# Patient Record
Sex: Male | Born: 1960 | Race: White | Hispanic: No | Marital: Married | Smoking: Never smoker
Health system: Southern US, Community
[De-identification: ages and names within clinical notes are randomized; demographics above are authoritative.]

## PROBLEM LIST (undated history)

## (undated) DIAGNOSIS — Z87442 Personal history of urinary calculi: Secondary | ICD-10-CM

## (undated) DIAGNOSIS — Z8489 Family history of other specified conditions: Secondary | ICD-10-CM

## (undated) DIAGNOSIS — R569 Unspecified convulsions: Secondary | ICD-10-CM

## (undated) DIAGNOSIS — Z9889 Other specified postprocedural states: Secondary | ICD-10-CM

## (undated) DIAGNOSIS — R112 Nausea with vomiting, unspecified: Secondary | ICD-10-CM

## (undated) DIAGNOSIS — I10 Essential (primary) hypertension: Secondary | ICD-10-CM

## (undated) DIAGNOSIS — K219 Gastro-esophageal reflux disease without esophagitis: Secondary | ICD-10-CM

## (undated) HISTORY — PX: ROTATOR CUFF REPAIR: SHX139

## (undated) HISTORY — PX: EYE SURGERY: SHX253

## (undated) HISTORY — PX: KNEE SURGERY: SHX244

## (undated) HISTORY — PX: LITHOTRIPSY: SUR834

## (undated) HISTORY — PX: APPENDECTOMY: SHX54

## (undated) HISTORY — PX: CHOLECYSTECTOMY: SHX55

## (undated) HISTORY — PX: TONSILLECTOMY: SUR1361

---

## 2004-04-25 HISTORY — PX: GASTRIC BYPASS: SHX52

## 2018-04-27 ENCOUNTER — Emergency Department (HOSPITAL_COMMUNITY)
Admission: EM | Admit: 2018-04-27 | Discharge: 2018-04-27 | Disposition: A | Discharge status: Home / Self Care | Payer: Self-pay | Attending: Emergency Medicine | Admitting: Emergency Medicine

## 2018-04-27 ENCOUNTER — Emergency Department (HOSPITAL_COMMUNITY): Payer: Self-pay

## 2018-04-27 ENCOUNTER — Encounter (HOSPITAL_COMMUNITY): Payer: Self-pay | Admitting: *Deleted

## 2018-04-27 DIAGNOSIS — I10 Essential (primary) hypertension: Secondary | ICD-10-CM | POA: Insufficient documentation

## 2018-04-27 DIAGNOSIS — M7981 Nontraumatic hematoma of soft tissue: Secondary | ICD-10-CM | POA: Insufficient documentation

## 2018-04-27 HISTORY — DX: Essential (primary) hypertension: I10

## 2018-04-27 HISTORY — DX: Unspecified convulsions: R56.9

## 2018-04-27 LAB — CBC
HCT: 34.7 % — ABNORMAL LOW (ref 39.0–52.0)
Hemoglobin: 10.6 g/dL — ABNORMAL LOW (ref 13.0–17.0)
MCH: 27.6 pg (ref 26.0–34.0)
MCHC: 30.5 g/dL (ref 30.0–36.0)
MCV: 90.4 fL (ref 80.0–100.0)
Platelets: 159 10*3/uL (ref 150–400)
RBC: 3.84 MIL/uL — ABNORMAL LOW (ref 4.22–5.81)
RDW: 16.4 % — ABNORMAL HIGH (ref 11.5–15.5)
WBC: 4.7 10*3/uL (ref 4.0–10.5)
nRBC: 0 % (ref 0.0–0.2)

## 2018-04-27 LAB — COMPREHENSIVE METABOLIC PANEL
ALT: 23 U/L (ref 0–44)
AST: 32 U/L (ref 15–41)
Albumin: 3.4 g/dL — ABNORMAL LOW (ref 3.5–5.0)
Alkaline Phosphatase: 49 U/L (ref 38–126)
Anion gap: 8 (ref 5–15)
BUN: 8 mg/dL (ref 6–20)
CO2: 26 mmol/L (ref 22–32)
Calcium: 8.5 mg/dL — ABNORMAL LOW (ref 8.9–10.3)
Chloride: 101 mmol/L (ref 98–111)
Creatinine, Ser: 0.87 mg/dL (ref 0.61–1.24)
GFR calc Af Amer: >60 mL/min (ref >60)
GFR calc non Af Amer: >60 mL/min (ref >60)
Glucose, Bld: 92 mg/dL (ref 70–99)
Potassium: 5 mmol/L (ref 3.5–5.1)
SODIUM: 135 mmol/L (ref 135–145)
Total Bilirubin: 0.3 mg/dL (ref 0.3–1.2)
Total Protein: 6.4 g/dL — ABNORMAL LOW (ref 6.5–8.1)

## 2018-04-27 LAB — URINALYSIS, ROUTINE W REFLEX MICROSCOPIC
Bacteria, UA: NONE SEEN
Bilirubin Urine: NEGATIVE
GLUCOSE, UA: NEGATIVE mg/dL
Ketones, ur: 5 mg/dL — AB
Leukocytes, UA: NEGATIVE
Nitrite: NEGATIVE
PROTEIN: NEGATIVE mg/dL
Specific Gravity, Urine: 1.011 (ref 1.005–1.030)
pH: 6 (ref 5.0–8.0)

## 2018-04-27 LAB — LIPASE, BLOOD: Lipase: 26 U/L (ref 11–51)

## 2018-04-27 MED ORDER — BENZONATATE 100 MG PO CAPS: 100.0000 mg | ORAL_CAPSULE | Freq: Three times a day (TID) | ORAL | 0 refills | Status: DC

## 2018-04-27 MED ORDER — MORPHINE SULFATE (PF) 4 MG/ML IV SOLN
4.0000 mg | Freq: Once | INTRAVENOUS | Status: AC
  Administered 2018-04-27: 4 mg via INTRAVENOUS
  Filled 2018-04-27: qty 1

## 2018-04-27 MED ORDER — SODIUM CHLORIDE 0.9 % IV BOLUS
1000.0000 mL | Freq: Once | INTRAVENOUS | Status: AC
  Administered 2018-04-27: 1000 mL via INTRAVENOUS

## 2018-04-27 MED ORDER — ONDANSETRON HCL 4 MG PO TABS: 4.0000 mg | ORAL_TABLET | Freq: Four times a day (QID) | ORAL | 0 refills | Status: DC

## 2018-04-27 MED ORDER — ONDANSETRON HCL 4 MG/2ML IJ SOLN
4.0000 mg | Freq: Once | INTRAMUSCULAR | Status: AC
  Administered 2018-04-27: 4 mg via INTRAVENOUS
  Filled 2018-04-27: qty 2

## 2018-04-27 MED ORDER — OXYCODONE-ACETAMINOPHEN 5-325 MG PO TABS: 1.0000 | ORAL_TABLET | Freq: Four times a day (QID) | ORAL | 0 refills | Status: DC | PRN

## 2018-04-27 MED ORDER — IOHEXOL 300 MG/ML  SOLN
100.0000 mL | Freq: Once | INTRAMUSCULAR | Status: AC | PRN
  Administered 2018-04-27: 100 mL via INTRAVENOUS

## 2018-04-27 NOTE — ED Triage Notes (Signed)
Pt in c/o LLQ abdominal pain that started a few days ago, reports nausea with this, area is tender to touch, coughing increases pain

## 2018-04-27 NOTE — Discharge Instructions (Signed)
You were evaluated in the emergency department for left-sided abdominal pain.  Your CAT scan showed that you had a rectus sheath hematoma.  This is a tear in your abdominal wall muscle.  This will usually repair itself over time.  We are sending you home with a prescription for pain medicine and nausea medicine along with some cough medicine.  We are also attempting to get you an abdominal binder which may help you stabilize that area.  Please follow-up with your doctor and return if any worsening symptoms.

## 2018-04-27 NOTE — ED Provider Notes (Signed)
[facility] Provider Note   CSN: [id number] Arrival date & time: 04/27/18  1221     History   Chief Complaint Chief Complaint  Patient presents with  . Abdominal Pain    HPI Gregory Blanchard is a 58 y.o. male.  He said he has had about a week of coughing.  He said about 2-week days ago he coughed so hard that he felt something tear in his left mid abdomen.  Since then the pain is been getting progressively worse and he rates it as severe now.  It is worse with palpation movement twisting.  He is also had a history of diverticulitis and kidney stone so he is not sure if it is that.  No fevers or chills no nausea no vomiting.  No diarrhea.  No urinary symptoms.  The history is provided by the patient.  Abdominal Pain   This is a new problem. The current episode started 2 days ago. The problem has been gradually worsening. The pain is associated with an unknown factor. The pain is located in the LLQ. The pain is severe. Pertinent negatives include fever, diarrhea, nausea, vomiting, constipation, dysuria, frequency and headaches. The symptoms are aggravated by certain positions, palpation and coughing. Nothing relieves the symptoms.    Past Medical History:  Diagnosis Date  . Hypertension   . Seizures (HCC)     There are no active problems to display for this patient.   History reviewed. No pertinent surgical history.      Home Medications    Prior to Admission medications   Not on File    Family History History reviewed. No pertinent family history.  Social History Social History   Tobacco Use  . Smoking status: Never Smoker  . Smokeless tobacco: Never Used  Substance Use Topics  . Alcohol use: Not on file  . Drug use: Not on file     Allergies   Sulfa antibiotics   Review of Systems Review of Systems  Constitutional: Negative for fever.  HENT: Negative for sore throat.   Eyes: Negative for visual disturbance.    Respiratory: Negative for shortness of breath.   Cardiovascular: Negative for chest pain.  Gastrointestinal: Positive for abdominal pain. Negative for constipation, diarrhea, nausea and vomiting.  Genitourinary: Negative for dysuria and frequency.  Musculoskeletal: Negative for back pain.  Skin: Negative for rash.  Neurological: Negative for headaches.     Physical Exam Updated Vital Signs BP (!) 138/100 (BP Location: Right Arm)   Pulse 83   Temp 98.3 F (36.8 C) (Oral)   Resp 17   SpO2 100%   Physical Exam Vitals signs and nursing note reviewed.  Constitutional:      Appearance: He is well-developed.  HENT:     Head: Normocephalic and atraumatic.  Eyes:     Conjunctiva/sclera: Conjunctivae normal.  Neck:     Musculoskeletal: Neck supple.  Cardiovascular:     Rate and Rhythm: Normal rate and regular rhythm.     Heart sounds: No murmur.  Pulmonary:     Effort: Pulmonary effort is normal. No respiratory distress.     Breath sounds: Normal breath sounds.  Abdominal:     Palpations: Abdomen is soft.     Tenderness: There is abdominal tenderness in the periumbilical area and left lower quadrant. There is no guarding or rebound.     Hernia: A hernia is present. Hernia is present in the ventral area.  Skin:    General: Skin  is warm and dry.     Capillary Refill: Capillary refill takes less than 2 seconds.  Neurological:     General: No focal deficit present.     Mental Status: He is alert.     Cranial Nerves: No cranial nerve deficit.     Motor: No weakness.      ED Treatments / Results  Labs (all labs ordered are listed, but only abnormal results are displayed) Labs Reviewed  COMPREHENSIVE METABOLIC PANEL - Abnormal; Notable for the following components:      Result Value   Calcium 8.5 (*)    Total Protein 6.4 (*)    Albumin 3.4 (*)    All other components within normal limits  CBC - Abnormal; Notable for the following components:   RBC 3.84 (*)     Hemoglobin 10.6 (*)    HCT 34.7 (*)    RDW 16.4 (*)    All other components within normal limits  URINALYSIS, ROUTINE W REFLEX MICROSCOPIC - Abnormal; Notable for the following components:   Hgb urine dipstick MODERATE (*)    Ketones, ur 5 (*)    All other components within normal limits  LIPASE, BLOOD    EKG None  Radiology Ct Abdomen Pelvis W Contrast  Result Date: 04/27/2018 CLINICAL DATA:  Acute generalized abdominal pain LEFT lower quadrant pain for few days, associated nausea, tenderness, and increased pain with coughing EXAM: CT ABDOMEN AND PELVIS WITH CONTRAST TECHNIQUE: Multidetector CT imaging of the abdomen and pelvis was performed using the standard protocol following bolus administration of intravenous contrast. Sagittal and coronal MPR images reconstructed from axial data set. CONTRAST:  OMNIPAQUE IOHEXOL 300 MG/ML SOLN IV. No oral contrast. COMPARISON:  None FINDINGS: Lower chest: Minimal RIGHT basilar atelectasis Hepatobiliary: Gallbladder surgically absent. Liver normal appearance. No biliary dilatation. Pancreas: Normal appearance Spleen: Normal appearance Adrenals/Urinary Tract: Adrenal glands normal appearance. BILATERAL nonobstructing renal calculi measuring up to 9 mm diameter at inferior pole RIGHT kidney and 6 mm diameter inferior pole LEFT kidney. Tiny cortical scar at upper pole RIGHT kidney. No renal mass or hydronephrosis. No ureteral calcification or dilatation. Bladder unremarkable. Stomach/Bowel: Increased stool in rectum. Postsurgical changes of gastric bypass surgery. Appendix not visualized. Bowel loops otherwise unremarkable. Vascular/Lymphatic: Aorta normal caliber. Circumaortic LEFT renal vein. No adenopathy. Reproductive: Unremarkable prostate gland and seminal vesicles Other: Small RIGHT inguinal hernia containing fat. No free air or free fluid. Focal thickening of the LEFT rectus abdominus muscle in the mid abdomen measuring 7.2 x 4.5 x 8.9 cm with mild  surrounding infiltrative change compatible with a LEFT rectus sheath hematoma. Musculoskeletal: No acute osseous findings. IMPRESSION: 7.2 x 4.5 x 8.9 cm LEFT rectus sheath hematoma. BILATERAL nonobstructing renal calculi. Small RIGHT inguinal hernia containing fat. Electronically Signed   By: Ulyses Southward M.D.   On: 04/27/2018 17:49    Procedures Procedures (including critical care time)  Medications Ordered in ED Medications  sodium chloride 0.9 % bolus 1,000 mL (0 mLs Intravenous Stopped 04/27/18 1748)  morphine 4 MG/ML injection 4 mg (4 mg Intravenous Given 04/27/18 1634)  ondansetron (ZOFRAN) injection 4 mg (4 mg Intravenous Given 04/27/18 1647)  iohexol (OMNIPAQUE) 300 MG/ML solution 100 mL (100 mLs Intravenous Contrast Given 04/27/18 1716)  morphine 4 MG/ML injection 4 mg (4 mg Intravenous Given 04/27/18 1746)     Initial Impression / Assessment and Plan / ED Course  I have reviewed the triage vital signs and the nursing notes.  Pertinent labs & imaging results  that were available during my care of the patient were reviewed by me and considered in my medical decision making (see chart for details).  Clinical Course as of Apr 29 1251  Caleen EssexFri Apr 27, 2018  1801 Patient CT significant for rectus sheath hematoma.  Few other incidental findings.  I reviewed all this with the patient and recommended that we send him home with some pain medication cough medication and will try to get him an abdominal binder.  He also asked for a prescription for some Zofran as the pain medication makes him nauseous.   [MB]    Clinical Course User Index [MB] Terrilee FilesButler, Mohd. Derflinger C, MD    Final Clinical Impressions(s) / ED Diagnoses   Final diagnoses:  Nontraumatic rectus hematoma    ED Discharge Orders         Ordered    ondansetron (ZOFRAN) 4 MG tablet  Every 6 hours     04/27/18 1803    oxyCODONE-acetaminophen (PERCOCET/ROXICET) 5-325 MG tablet  Every 6 hours PRN     04/27/18 1803    benzonatate (TESSALON)  100 MG capsule  Every 8 hours     04/27/18 1805           Terrilee FilesButler, Reese Senk C, MD 04/28/18 1253

## 2018-07-26 ENCOUNTER — Ambulatory Visit: Payer: Self-pay | Admitting: Family Medicine

## 2018-09-01 ENCOUNTER — Emergency Department (HOSPITAL_COMMUNITY): Payer: Self-pay

## 2018-09-01 ENCOUNTER — Encounter (HOSPITAL_COMMUNITY): Payer: Self-pay | Admitting: Emergency Medicine

## 2018-09-01 ENCOUNTER — Other Ambulatory Visit: Payer: Self-pay

## 2018-09-01 ENCOUNTER — Emergency Department (HOSPITAL_COMMUNITY)
Admission: EM | Admit: 2018-09-01 | Discharge: 2018-09-01 | Disposition: A | Discharge status: Home / Self Care | Payer: Self-pay | Attending: Emergency Medicine | Admitting: Emergency Medicine

## 2018-09-01 DIAGNOSIS — M545 Low back pain, unspecified: Secondary | ICD-10-CM

## 2018-09-01 DIAGNOSIS — H5702 Anisocoria: Secondary | ICD-10-CM | POA: Insufficient documentation

## 2018-09-01 DIAGNOSIS — W1830XA Fall on same level, unspecified, initial encounter: Secondary | ICD-10-CM | POA: Insufficient documentation

## 2018-09-01 DIAGNOSIS — W19XXXA Unspecified fall, initial encounter: Secondary | ICD-10-CM

## 2018-09-01 DIAGNOSIS — Y92010 Kitchen of single-family (private) house as the place of occurrence of the external cause: Secondary | ICD-10-CM | POA: Insufficient documentation

## 2018-09-01 DIAGNOSIS — M542 Cervicalgia: Secondary | ICD-10-CM | POA: Insufficient documentation

## 2018-09-01 DIAGNOSIS — G8911 Acute pain due to trauma: Secondary | ICD-10-CM

## 2018-09-01 DIAGNOSIS — M25512 Pain in left shoulder: Secondary | ICD-10-CM | POA: Insufficient documentation

## 2018-09-01 DIAGNOSIS — Y939 Activity, unspecified: Secondary | ICD-10-CM | POA: Insufficient documentation

## 2018-09-01 DIAGNOSIS — R51 Headache: Secondary | ICD-10-CM | POA: Insufficient documentation

## 2018-09-01 DIAGNOSIS — Y999 Unspecified external cause status: Secondary | ICD-10-CM | POA: Insufficient documentation

## 2018-09-01 DIAGNOSIS — M25532 Pain in left wrist: Secondary | ICD-10-CM | POA: Insufficient documentation

## 2018-09-01 DIAGNOSIS — S7012XA Contusion of left thigh, initial encounter: Secondary | ICD-10-CM | POA: Insufficient documentation

## 2018-09-01 LAB — CBC WITH DIFFERENTIAL/PLATELET
Abs Immature Granulocytes: 0.02 10*3/uL (ref 0.00–0.07)
Basophils Absolute: 0 10*3/uL (ref 0.0–0.1)
Basophils Relative: 0 %
Eosinophils Absolute: 0.2 10*3/uL (ref 0.0–0.5)
Eosinophils Relative: 2 %
HCT: 35 % — ABNORMAL LOW (ref 39.0–52.0)
Hemoglobin: 10.8 g/dL — ABNORMAL LOW (ref 13.0–17.0)
Immature Granulocytes: 0 %
Lymphocytes Relative: 23 %
Lymphs Abs: 1.9 10*3/uL (ref 0.7–4.0)
MCH: 27.8 pg (ref 26.0–34.0)
MCHC: 30.9 g/dL (ref 30.0–36.0)
MCV: 90 fL (ref 80.0–100.0)
Monocytes Absolute: 0.7 10*3/uL (ref 0.1–1.0)
Monocytes Relative: 9 %
Neutro Abs: 5.3 10*3/uL (ref 1.7–7.7)
Neutrophils Relative %: 66 %
Platelets: 261 10*3/uL (ref 150–400)
RBC: 3.89 MIL/uL — ABNORMAL LOW (ref 4.22–5.81)
RDW: 18 % — ABNORMAL HIGH (ref 11.5–15.5)
WBC: 8.2 10*3/uL (ref 4.0–10.5)
nRBC: 0 % (ref 0.0–0.2)

## 2018-09-01 LAB — BASIC METABOLIC PANEL
Anion gap: 8 (ref 5–15)
BUN: 10 mg/dL (ref 6–20)
CO2: 24 mmol/L (ref 22–32)
Calcium: 8.8 mg/dL — ABNORMAL LOW (ref 8.9–10.3)
Chloride: 106 mmol/L (ref 98–111)
Creatinine, Ser: 0.89 mg/dL (ref 0.61–1.24)
GFR calc Af Amer: >60 mL/min (ref >60)
GFR calc non Af Amer: >60 mL/min (ref >60)
Glucose, Bld: 98 mg/dL (ref 70–99)
Potassium: 4.3 mmol/L (ref 3.5–5.1)
Sodium: 138 mmol/L (ref 135–145)

## 2018-09-01 LAB — APTT: aPTT: 32 seconds (ref 24–36)

## 2018-09-01 LAB — PROTIME-INR
INR: 1.1 (ref 0.8–1.2)
Prothrombin Time: 13.7 seconds (ref 11.4–15.2)

## 2018-09-01 MED ORDER — ONDANSETRON 4 MG PO TBDP
4.0000 mg | ORAL_TABLET | Freq: Once | ORAL | Status: AC
  Administered 2018-09-01: 4 mg via ORAL
  Filled 2018-09-01: qty 1

## 2018-09-01 MED ORDER — OXYCODONE HCL 5 MG PO TABS: 5.0000 mg | ORAL_TABLET | Freq: Four times a day (QID) | ORAL | 0 refills | Status: DC | PRN

## 2018-09-01 MED ORDER — ACETAMINOPHEN 500 MG PO TABS
1000.0000 mg | ORAL_TABLET | Freq: Once | ORAL | Status: AC
  Administered 2018-09-01: 1000 mg via ORAL
  Filled 2018-09-01: qty 2

## 2018-09-01 MED ORDER — OXYCODONE HCL 5 MG PO TABS
5.0000 mg | ORAL_TABLET | Freq: Once | ORAL | Status: AC
  Administered 2018-09-01: 5 mg via ORAL
  Filled 2018-09-01: qty 1

## 2018-09-01 MED ORDER — HYDROMORPHONE HCL 1 MG/ML IJ SOLN
1.0000 mg | Freq: Once | INTRAMUSCULAR | Status: AC
  Administered 2018-09-01: 1 mg via INTRAVENOUS
  Filled 2018-09-01: qty 1

## 2018-09-01 MED ORDER — CYCLOBENZAPRINE HCL 10 MG PO TABS: 10.0000 mg | ORAL_TABLET | Freq: Two times a day (BID) | ORAL | 0 refills | Status: DC | PRN

## 2018-09-01 MED ORDER — ONDANSETRON HCL 4 MG PO TABS: 4.0000 mg | ORAL_TABLET | Freq: Three times a day (TID) | ORAL | 0 refills | Status: DC | PRN

## 2018-09-01 NOTE — ED Provider Notes (Signed)
[facility] EMERGENCY DEPARTMENT Provider Note   CSN: [id number] Arrival date & time: 09/01/18  0940    History   Chief Complaint Chief Complaint  Patient presents with   Fall   Hip Pain    HPI Gregory Blanchard is a 58 y.o. male with history of hypertension and seizures presents for evaluation of acute onset, progressively worsening ecchymosis and swelling to the left hip after fall yesterday.  He reports that he slipped in the kitchen on a slippery substance on the floor, landing on his left side.  He is unsure if he hit his head or lost consciousness.  He reports immediate onset of left shoulder and left wrist pain as well as left hip pain.  Also notes immediate development of ecchymosis to the lateral aspect of the left hip and thigh, with progressively worsened overnight and when he awoke this morning he noticed a very large hematoma to the lateral aspect of his left hip and thigh.  He notes a constant pain which worsens with prolonged positioning as well as ambulating.  Also reports neck and low back pain which she reports is generally chronic for him secondary to known arthritis.  Reports the pain in the left shoulder and wrist are mild.  Also notes a frontal headache which he states is mild in nature.  Denies fever, nausea, vomiting, chest pain, shortness of breath, abdominal pain, vision changes, or confusion.  Denies bowel or bladder incontinence, saddle paresthesias, or history of IV drug use.  He has been taking over-the-counter anti-inflammatories without relief of his symptoms.  He has a history of cluster headaches.  No known history of blood dyscrasia and is not currently on any anticoagulants.    The history is provided by the patient.    Past Medical History:  Diagnosis Date   Hypertension    Seizures (HCC)     There are no active problems to display for this patient.   History reviewed. No pertinent surgical history.      Home Medications     Prior to Admission medications   Medication Sig Start Date End Date Taking? Authorizing Provider  naproxen sodium (ALEVE) 220 MG tablet Take 220 mg by mouth daily as needed (pain).   Yes [provider]  omeprazole (PRILOSEC) 20 MG capsule Take 20 mg by mouth daily.   Yes [provider]  benzonatate (TESSALON) 100 MG capsule Take 1 capsule (100 mg total) by mouth every 8 (eight) hours. Patient not taking: Reported on 09/01/2018 04/27/18   Terrilee Files, MD  cyclobenzaprine (FLEXERIL) 10 MG tablet Take 1 tablet (10 mg total) by mouth 2 (two) times daily as needed for muscle spasms. 09/01/18   Raygen Dahm A, PA-C  ondansetron (ZOFRAN) 4 MG tablet Take 1 tablet (4 mg total) by mouth every 8 (eight) hours as needed for nausea or vomiting. 09/01/18   Lyrika Souders A, PA-C  oxyCODONE (ROXICODONE) 5 MG immediate release tablet Take 1 tablet (5 mg total) by mouth every 6 (six) hours as needed for severe pain. 09/01/18   Laquetta Racey A, PA-C  oxyCODONE-acetaminophen (PERCOCET/ROXICET) 5-325 MG tablet Take 1-2 tablets by mouth every 6 (six) hours as needed for severe pain. Patient not taking: Reported on 09/01/2018 04/27/18   Terrilee Files, MD    Family History No family history on file.  Social History Social History   Tobacco Use   Smoking status: Never Smoker   Smokeless tobacco: Never Used  Substance Use Topics  Alcohol use: Not on file   Drug use: Not on file     Allergies   Sulfa antibiotics   Review of Systems Review of Systems  Constitutional: Negative for chills and fever.  Eyes: Negative for photophobia and visual disturbance.  Respiratory: Negative for shortness of breath.   Gastrointestinal: Negative for abdominal pain, nausea and vomiting.  Musculoskeletal: Positive for back pain and neck pain.  Skin: Positive for color change.  Neurological: Positive for headaches.  All other systems reviewed and are negative.    Physical Exam Updated Vital  Signs BP 122/90    Pulse 84    Temp 98.2 F (36.8 C)    Resp 19    Ht  (1.854 m)    Wt 113.4 kg    SpO2 97%    BMI 32.98 kg/m   Physical Exam Vitals signs and nursing note reviewed.  Constitutional:      General: He is not in acute distress.    Appearance: He is well-developed.  HENT:     Head: Normocephalic and atraumatic.     Comments: No Battle's signs, no raccoon's eyes, no rhinorrhea. No hemotympanum. No tenderness to palpation of the face or skull. No deformity, crepitus, or swelling noted.  Eyes:     General:        Right eye: No discharge.        Left eye: No discharge.     Extraocular Movements: Extraocular movements intact.     Conjunctiva/sclera: Conjunctivae normal.     Comments: Anisocoria noted, left pupil 5mm and reactive to light, right pupil 2mm and minimally reactive to light.  No chemosis, proptosis, or consensual photophobia  Neck:     Musculoskeletal: Neck supple.     Vascular: No JVD.     Trachea: No tracheal deviation.     Comments: Some midline cervical spine tenderness around C3-C6 with left paracervical muscle tenderness and spasm in the trapezius distribution.  No deformity or crepitus noted. Cardiovascular:     Rate and Rhythm: Normal rate and regular rhythm.     Pulses: Normal pulses.     Comments: 2+ radial and DP/PT pulses bilaterally Pulmonary:     Effort: Pulmonary effort is normal.     Breath sounds: Normal breath sounds.  Abdominal:     General: Abdomen is flat. There is no distension.     Palpations: Abdomen is soft.     Tenderness: There is no abdominal tenderness. There is no guarding or rebound.  Musculoskeletal:     Comments: Left paralumbar muscle tenderness.  No deformity, crepitus, or step-off noted on palpation of the midline thoracic or lumbar spine.  5/5 strength of BUE and BLE major muscle groups.  22 x 17 cm hematoma noted to the lateral aspect of the left thigh.  Normal passive range of motion of the hip.  No deformities  noted.  No significant tenderness to palpation of the left shoulder or wrist.  No snuffbox tenderness.  Skin:    General: Skin is warm and dry.     Findings: No erythema.  Neurological:     Mental Status: He is alert.  Psychiatric:        Behavior: Behavior normal.      ED Treatments / Results  Labs (all labs ordered are listed, but only abnormal results are displayed) Labs Reviewed  CBC WITH DIFFERENTIAL/PLATELET - Abnormal; Notable for the following components:      Result Value   RBC 3.89 (*)  Hemoglobin 10.8 (*)    HCT 35.0 (*)    RDW 18.0 (*)    All other components within normal limits  BASIC METABOLIC PANEL - Abnormal; Notable for the following components:   Calcium 8.8 (*)    All other components within normal limits  PROTIME-INR  APTT    EKG None  Radiology Dg Wrist Complete Left  Result Date: 09/01/2018 CLINICAL DATA:  Fall, left wrist pain EXAM: LEFT WRIST - COMPLETE 3+ VIEW COMPARISON:  None. FINDINGS: Normal alignment without acute osseous finding or fracture. Distal radius, ulna and carpal bones appear intact. 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 IMPRESSION: Degenerative osteoarthritis.  No acute finding by plain radiography Electronically Signed   By: Judie Petit.  Shick M.D.   On: 09/01/2018 11:00   Ct Head Wo Contrast  Result Date: 09/01/2018 CLINICAL DATA:  Posttraumatic headache after fall last night. EXAM: CT HEAD WITHOUT CONTRAST CT CERVICAL SPINE WITHOUT CONTRAST TECHNIQUE: Multidetector CT imaging of the head and cervical spine was performed following the standard protocol without intravenous contrast. Multiplanar CT image reconstructions of the cervical spine were also generated. COMPARISON:  None. FINDINGS: CT HEAD FINDINGS Brain: No evidence of acute infarction, hemorrhage, hydrocephalus, extra-axial collection or mass lesion/mass effect.  Vascular: No hyperdense vessel or unexpected calcification. Skull: Normal. Negative for fracture or focal lesion. Sinuses/Orbits: No acute finding. Other: None. CT CERVICAL SPINE FINDINGS Alignment: Normal. Skull base and vertebrae: No acute fracture. No primary bone lesion or focal pathologic process. Soft tissues and spinal canal: No prevertebral fluid or swelling. No visible canal hematoma. Disc levels: Severe degenerative disc disease is noted at C5-6 and C6-7 with anterior osteophyte formation at this level. Large anterior osteophyte formation is also noted at C3-4 and C4-5. Upper chest: Negative. Other: None. IMPRESSION: Normal head CT. Severe multilevel degenerative disc disease. No acute abnormality seen in the cervical spine. Electronically Signed   By: Lupita Raider M.D.   On: 09/01/2018 12:32   Ct Cervical Spine Wo Contrast  Result Date: 09/01/2018 CLINICAL DATA:  Posttraumatic headache after fall last night. EXAM: CT HEAD WITHOUT CONTRAST CT CERVICAL SPINE WITHOUT CONTRAST TECHNIQUE: Multidetector CT imaging of the head and cervical spine was performed following the standard protocol without intravenous contrast. Multiplanar CT image reconstructions of the cervical spine were also generated. COMPARISON:  None. FINDINGS: CT HEAD FINDINGS Brain: No evidence of acute infarction, hemorrhage, hydrocephalus, extra-axial collection or mass lesion/mass effect. Vascular: No hyperdense vessel or unexpected calcification. Skull: Normal. Negative for fracture or focal lesion. Sinuses/Orbits: No acute finding. Other: None. CT CERVICAL SPINE FINDINGS Alignment: Normal. Skull base and vertebrae: No acute fracture. No primary bone lesion or focal pathologic process. Soft tissues and spinal canal: No prevertebral fluid or swelling. No visible canal hematoma. Disc levels: Severe degenerative disc disease is noted at C5-6 and C6-7 with anterior osteophyte formation at this level. Large anterior osteophyte formation is  also noted at C3-4 and C4-5. Upper chest: Negative. Other: None. IMPRESSION: Normal head CT. Severe multilevel degenerative disc disease. No acute abnormality seen in the cervical spine. Electronically Signed   By: Lupita Raider M.D.   On: 09/01/2018 12:32   Dg Hip Unilat W Or Wo Pelvis 2-3 Views Left  Result Date: 09/01/2018 CLINICAL DATA:  Pt states he slipped in the kitchen last night and fell, has bruising and swelling to the left hip/thigh. Pt also has pain  to left wrist. fall EXAM: DG HIP (WITH OR WITHOUT PELVIS) 2-3V LEFT COMPARISON:  CT 04/27/2018 FINDINGS: No evidence of pelvic fracture or sacral fracture. Hips are located. Dedicated evaluation of the LEFT femur demonstrates no femoral neck fracture. IMPRESSION: No pelvic fracture or LEFT femoral neck fracture. Electronically Signed   By: Genevive BiStewart  Edmunds M.D.   On: 09/01/2018 11:02    Procedures Procedures (including critical care time)  Medications Ordered in ED Medications  oxyCODONE (Oxy IR/ROXICODONE) immediate release tablet 5 mg (5 mg Oral Given 09/01/18 1115)  acetaminophen (TYLENOL) tablet 1,000 mg (1,000 mg Oral Given 09/01/18 1115)  ondansetron (ZOFRAN-ODT) disintegrating tablet 4 mg (4 mg Oral Given 09/01/18 1115)  HYDROmorphone (DILAUDID) injection 1 mg (1 mg Intravenous Given 09/01/18 1232)     Initial Impression / Assessment and Plan / ED Course  I have reviewed the triage vital signs and the nursing notes.  Pertinent labs & imaging results that were available during my care of the patient were reviewed by me and considered in my medical decision making (see chart for details).        Patient presenting for evaluation after fall.  He is afebrile, initially hypertensive but I feel this is secondary to pain as his vital signs improved after administration of pain medicine. He is nontoxic in appearance.  He is neurovascularly intact.  His primary complaint is pain secondary to a large hematoma to the left hip/thigh.  Of  note, he was seen a few months ago with a rectus hematoma after persistent cough.  He reports that he has not been seen by a PCP [place] and has not had any blood work done in a few years.  He does not think that he has any history of coagulopathies/blood dyscrasias and he is not currently anticoagulated.  Also of note, he exhibited unequal pupils on physical examination.  He states that he has a cataract to the left eye so this may be baseline for him but he is unsure.  He is complaining of a mild frontal headache, not as severe as his usual cluster headaches.  He has an overall normal neurologic examination and no ptosis or anhydrosis to suggest Horner syndrome.  Given his reassuring neurologic status and negative head CT, doubt SAH, ICH, CVA, brain herniation, or other life-threatening intracranial abnormality.  Imaging of the cervical spine significant for multilevel degenerative disc disease but no acute abnormalities.  Doubt spinal cord syndrome, no red flag signs concerning for cauda equina or spinal abscess.  Lab work reviewed by me shows no leukocytosis, anemia at patient's baseline per the patient reviewing MyChart on his phone.  No metabolic derangements, no renal insufficiency.  INR and APTT within normal limits, which is reassuring.  Radiographs of the pelvis, left hip and left wrist show no evidence of acute osseous abnormality.  Pain is controlled in the ED.  He is able to ambulate with the aid of crutches.  Will give Ace wrap for compression, oxycodone for severe breakthrough pain and Flexeril for muscle relaxation purposes.  We discussed side effects of medications and appropriate usage.  Also discussed conservative management with heat therapy, gentle stretching, and follow-up with a PCP for reevaluation of his predisposition to hematomas and possible missed fracture diagnosis if he experiences any persistent pains.  Discussed strict ED return precautions. Pt  verbalized understanding of and agreement with plan and is safe for discharge home at this time.   Final Clinical Impressions(s) /  ED Diagnoses   Final diagnoses:  Fall, initial encounter  Hematoma of left thigh, initial encounter  Neck pain on left side  Acute pain of left wrist  Acute pain of left shoulder due to trauma  Acute right-sided low back pain without sciatica  Anisocoria    ED Discharge Orders         Ordered    cyclobenzaprine (FLEXERIL) 10 MG tablet  2 times daily PRN     09/01/18 1339    oxyCODONE (ROXICODONE) 5 MG immediate release tablet  Every 6 hours PRN     09/01/18 1339    ondansetron (ZOFRAN) 4 MG tablet  Every 8 hours PRN     09/01/18 1339           Jeanie Sewer, PA-C 09/01/18 1432    Alvira Monday, MD 09/03/18 1007

## 2018-09-01 NOTE — Discharge Instructions (Signed)
1. Medications: Alternate 600 mg of ibuprofen and [id number] mg of Tylenol every 3 hours as needed for pain. Do not exceed 4000 mg of Tylenol daily.  Take ibuprofen with food to avoid upset stomach issues.  You can take oxycodone as needed for severe breakthrough pain but do not drive, drink alcohol, or operate heavy machinery while taking this medicine as it may make you drowsy.  The same applies for Flexeril which is a muscle relaxer.  I typically recommend only taking these medications at night to help you sleep.  You can also cut these tablets in half if they are too strong.  Oxycodone can also cause constipation, so you may want to take a stool softener to help avoid the side effect. 2. Treatment: rest, apply Ace wrap for compression.  Use crutches or walker to help you get around, weight-bear on the left hip as tolerated.  Apply ice or heat, whichever feels best, 20 minutes at a time 3-4 times daily.  Do some gentle stretching to avoid muscle stiffness and soreness.  You can also YouTube search physical therapy exercises for the neck, hip, and low back. 3. Follow Up: Please followup with a primary care provider  in 1-2 week if no improvement for discussion of your diagnoses and further evaluation after today's visit; if you do not have a primary care doctor use the resource guide provided to find one; Please return to the ER for worsening symptoms or other concerns such as worsening swelling, redness of the skin, fevers, severe headaches, persistent vomiting, loss of pulses, or loss of feeling

## 2018-09-01 NOTE — ED Triage Notes (Signed)
Pt states he slipped in the kitchen last night, has bruising and swelling to the left hip/thigh. Pt did not hit head, is not on blood thinners. Pt also has pain to left wrist.

## 2018-09-01 NOTE — ED Triage Notes (Signed)
Pt had a fall last night, landed on L hip. C/o large football sized hematoma present to L lateral hip, does not take thinners. States he is able to bear weight on L lower extremity.

## 2018-10-07 ENCOUNTER — Emergency Department (HOSPITAL_COMMUNITY): Payer: Self-pay

## 2018-10-07 ENCOUNTER — Emergency Department (HOSPITAL_COMMUNITY)
Admission: EM | Admit: 2018-10-07 | Discharge: 2018-10-07 | Disposition: A | Discharge status: Home / Self Care | Payer: Self-pay | Attending: Emergency Medicine | Admitting: Emergency Medicine

## 2018-10-07 DIAGNOSIS — Y9389 Activity, other specified: Secondary | ICD-10-CM | POA: Insufficient documentation

## 2018-10-07 DIAGNOSIS — Y9222 Religious institution as the place of occurrence of the external cause: Secondary | ICD-10-CM | POA: Insufficient documentation

## 2018-10-07 DIAGNOSIS — W01190A Fall on same level from slipping, tripping and stumbling with subsequent striking against furniture, initial encounter: Secondary | ICD-10-CM | POA: Insufficient documentation

## 2018-10-07 DIAGNOSIS — S8001XA Contusion of right knee, initial encounter: Secondary | ICD-10-CM | POA: Insufficient documentation

## 2018-10-07 DIAGNOSIS — Y999 Unspecified external cause status: Secondary | ICD-10-CM | POA: Insufficient documentation

## 2018-10-07 DIAGNOSIS — Z79899 Other long term (current) drug therapy: Secondary | ICD-10-CM | POA: Insufficient documentation

## 2018-10-07 DIAGNOSIS — I10 Essential (primary) hypertension: Secondary | ICD-10-CM | POA: Insufficient documentation

## 2018-10-07 LAB — CBC
HCT: 32.6 % — ABNORMAL LOW (ref 39.0–52.0)
Hemoglobin: 10.1 g/dL — ABNORMAL LOW (ref 13.0–17.0)
MCH: 27.4 pg (ref 26.0–34.0)
MCHC: 31 g/dL (ref 30.0–36.0)
MCV: 88.3 fL (ref 80.0–100.0)
Platelets: 281 10*3/uL (ref 150–400)
RBC: 3.69 MIL/uL — ABNORMAL LOW (ref 4.22–5.81)
RDW: 16 % — ABNORMAL HIGH (ref 11.5–15.5)
WBC: 9.7 10*3/uL (ref 4.0–10.5)
nRBC: 0 % (ref 0.0–0.2)

## 2018-10-07 LAB — BASIC METABOLIC PANEL
Anion gap: 9 (ref 5–15)
BUN: 9 mg/dL (ref 6–20)
CO2: 21 mmol/L — ABNORMAL LOW (ref 22–32)
Calcium: 8.4 mg/dL — ABNORMAL LOW (ref 8.9–10.3)
Chloride: 107 mmol/L (ref 98–111)
Creatinine, Ser: 0.97 mg/dL (ref 0.61–1.24)
GFR calc Af Amer: >60 mL/min (ref >60)
GFR calc non Af Amer: >60 mL/min (ref >60)
Glucose, Bld: 101 mg/dL — ABNORMAL HIGH (ref 70–99)
Potassium: 3.8 mmol/L (ref 3.5–5.1)
Sodium: 137 mmol/L (ref 135–145)

## 2018-10-07 MED ORDER — ONDANSETRON HCL 4 MG/2ML IJ SOLN
4.0000 mg | Freq: Once | INTRAMUSCULAR | Status: AC
  Administered 2018-10-07: 4 mg via INTRAVENOUS
  Filled 2018-10-07: qty 2

## 2018-10-07 MED ORDER — ONDANSETRON 8 MG PO TBDP: 8.0000 mg | ORAL_TABLET | Freq: Three times a day (TID) | ORAL | 0 refills | Status: DC | PRN

## 2018-10-07 MED ORDER — OXYCODONE-ACETAMINOPHEN 5-325 MG PO TABS: 1.0000 | ORAL_TABLET | ORAL | 0 refills | Status: DC | PRN

## 2018-10-07 MED ORDER — OXYCODONE-ACETAMINOPHEN 5-325 MG PO TABS
2.0000 | ORAL_TABLET | Freq: Once | ORAL | Status: AC
  Administered 2018-10-07: 2 via ORAL
  Filled 2018-10-07: qty 2

## 2018-10-07 MED ORDER — HYDROMORPHONE HCL 1 MG/ML IJ SOLN
1.0000 mg | Freq: Once | INTRAMUSCULAR | Status: AC
  Administered 2018-10-07: 1 mg via INTRAVENOUS
  Filled 2018-10-07: qty 1

## 2018-10-07 NOTE — ED Notes (Signed)
Ortho paged. 

## 2018-10-07 NOTE — Discharge Instructions (Signed)
Apply ice. Elevate your leg Weightbearing as tolerated  Call the orthopedic surgeon for follow-up  Please return to the emergency department for new or worsening symptoms

## 2018-10-07 NOTE — ED Notes (Signed)
Ice to Right knee.

## 2018-10-07 NOTE — Progress Notes (Signed)
Orthopedic Tech Progress Note Patient Details:  Epic Tribbett [birth year] [id number]  Ortho Devices Type of Ortho Device: Ace wrap, Knee Immobilizer Ortho Device/Splint Interventions: Application   Post Interventions Patient Tolerated: Well Instructions Provided: Care of device   [place] Pink 10/07/2018, 4:04 PM

## 2018-10-07 NOTE — ED Notes (Signed)
Pt reports having his own crutches at home.

## 2018-10-07 NOTE — ED Provider Notes (Signed)
[facility] EMERGENCY DEPARTMENT Provider Note   CSN: [id number] Arrival date & time: 10/07/18  1239     History   Chief Complaint No chief complaint on file.   HPI Gregory Blanchard is a 58 y.o. male.     HPI 58 year old male presents the emergency department after acute trauma and injury to his right knee after falling over a small table.  He states that he landed directly on his right knee.  He presents with a significant sized hematoma of his right knee.  He reports pain with range of motion.  He denies numbness and tingling in his right lower extremity.  No other injury.  Pain is moderate to severe in severity.   Past Medical History:  Diagnosis Date  . Hypertension   . Seizures ([place])     There are no active problems to display for this patient.   No past surgical history on file.      Home Medications    Prior to Admission medications   Medication Sig Start Date End Date Taking? Authorizing Provider  benzonatate (TESSALON) 100 MG capsule Take 1 capsule (100 mg total) by mouth every 8 (eight) hours. Patient not taking: Reported on 09/01/2018 04/27/18   Hayden Rasmussen, MD  cyclobenzaprine (FLEXERIL) 10 MG tablet Take 1 tablet (10 mg total) by mouth 2 (two) times daily as needed for muscle spasms. 09/01/18   Fawze, Mina A, PA-C  naproxen sodium (ALEVE) 220 MG tablet Take 220 mg by mouth daily as needed (pain).    [provider]  omeprazole (PRILOSEC) 20 MG capsule Take 20 mg by mouth daily.    [provider]  ondansetron (ZOFRAN) 4 MG tablet Take 1 tablet (4 mg total) by mouth every 8 (eight) hours as needed for nausea or vomiting. 09/01/18   Fawze, Mina A, PA-C  oxyCODONE (ROXICODONE) 5 MG immediate release tablet Take 1 tablet (5 mg total) by mouth every 6 (six) hours as needed for severe pain. 09/01/18   Fawze, Mina A, PA-C  oxyCODONE-acetaminophen (PERCOCET/ROXICET) 5-325 MG tablet Take 1-2 tablets by mouth every 6 (six) hours as needed  for severe pain. Patient not taking: Reported on 09/01/2018 04/27/18   Hayden Rasmussen, MD    Family History No family history on file.  Social History Social History   Tobacco Use  . Smoking status: Never Smoker  . Smokeless tobacco: Never Used  Substance Use Topics  . Alcohol use: Not on file  . Drug use: Not on file     Allergies   Sulfa antibiotics   Review of Systems Review of Systems  All other systems reviewed and are negative.    Physical Exam Updated Vital Signs BP 130/87 (BP Location: Right Arm)   Pulse 92   Temp 98.3 F (36.8 C) (Oral)   Resp 18   SpO2 97%   Physical Exam Vitals signs and nursing note reviewed.  Constitutional:      Appearance: He is well-developed.  HENT:     Head: Normocephalic.  Neck:     Musculoskeletal: Normal range of motion.  Pulmonary:     Effort: Pulmonary effort is normal.  Abdominal:     General: There is no distension.  Musculoskeletal:     Comments: Significant ecchymosis and swelling of his right knee.  Able to extend at the right knee.  Normal PT and DP pulses in the right foot.  Compartments of the right lower extremity are otherwise soft.  Neurological:  Mental Status: He is alert and oriented to person, place, and time.      ED Treatments / Results  Labs (all labs ordered are listed, but only abnormal results are displayed) Labs Reviewed  CBC - Abnormal; Notable for the following components:      Result Value   RBC 3.69 (*)    Hemoglobin 10.1 (*)    HCT 32.6 (*)    RDW 16.0 (*)    All other components within normal limits  BASIC METABOLIC PANEL    EKG    Radiology Ct Knee Right Wo Contrast  Result Date: 10/07/2018 CLINICAL DATA:  Fall landing on the right knee. Right knee pain with swelling and bruising. Faint calcification along the anterior inferior patella for further characterization EXAM: CT OF THE right KNEE WITHOUT CONTRAST TECHNIQUE: Multidetector CT imaging of the right knee was  performed according to the standard protocol. Multiplanar CT image reconstructions were also generated. COMPARISON:  10/07/2018 radiographs FINDINGS: Bones/Joint/Cartilage Distal quadriceps and patellar tendon spurring, the patellar tendon spurring accounts for the calcifications seen on conventional radiography. There is also marginal spurring in the patella. There is a subtle linear nondisplaced fracture extending vertically along the lateral pole of the patella, visible long the cortex for example on image 23/6 and image 27/6, and faintly visible on image 72/3. Small amount of ossification along the proximal MCL compatible with Pellegrini-Stieda disease. Lateral and to a lesser extent medial compartmental narrowing in the knee with considerable marginal spurring and mild subcortical sclerosis. Trace knee joint effusion. Ligaments Suboptimally assessed by CT. Small amount of ossification along the proximal MCL compatible with Pellegrini-Stieda disease. Muscles and Tendons Unremarkable Soft tissues Subcutaneous hematoma anteromedial to the patella measuring 5.2 by 10.5 by 8.7 cm (volume = 250 cm^3), with surrounding subcutaneous edema. This hematoma partially extends along the superficial fascia margin of the distal vastus medialis. IMPRESSION: 1. Subtle linear vertical nondisplaced fracture of the lateral pole of the patella. 2. 250 cubic cm subcutaneous hematoma anteromedial to the patella and medial patellar retinaculum, and partially extending along the superficial fascia margin of the distal vastus medialis. Surrounding edema noted. 3. Distal quadriceps and patellar tendon spurring along with tricompartmental degenerative spurring in the knee. 4. 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 5. Trace knee joint effusion. Electronically Signed   By: Gaylyn RongWalter  Liebkemann M.D.   On: 10/07/2018 15:33   Dg Knee Complete 4 Views Right  Result Date: 10/07/2018  CLINICAL DATA:  Pain after fall. EXAM: RIGHT KNEE - COMPLETE 4+ VIEW COMPARISON:  None. FINDINGS: There is significant anterior soft tissue swelling, likely a hematoma. There is a soft tissue calcification anterior to the patella on the lateral view. The patella is otherwise normal in appearance. No other fractures. No other abnormalities. IMPRESSION: There is a soft tissue calcification anterior to the inferior aspect of the patella on the lateral view with significant overlying soft tissue swelling. The soft tissue calcification could represent a small fracture fragment. No fracture lines are seen extending through body of the patella. Electronically Signed   By: Gerome Samavid  Williams III M.D   On: 10/07/2018 13:39    Procedures .Splint Application  Date/Time: 10/07/2018 4:32 PM Performed by: Azalia Bilisampos, Kerem Gilmer, MD Authorized by: Azalia Bilisampos, Donevin Sainsbury, MD     SPLINT APPLICATION Authorized by: Azalia BilisKevin Stephine Langbehn Consent: Verbal consent obtained. Risks and benefits: risks, benefits and alternatives were discussed Consent given by: patient Splint applied by: orthopedic technician Location details: Right lower extremity  Splint type: Knee immobilizer Supplies used: Knee immobilizer Post-procedure: The splinted body part was neurovascularly unchanged following the procedure. Patient tolerance: Patient tolerated the procedure well with no immediate complications.     Medications Ordered in ED Medications  ondansetron (ZOFRAN) injection 4 mg (has no administration in time range)  HYDROmorphone (DILAUDID) injection 1 mg (has no administration in time range)     Initial Impression / Assessment and Plan / ED Course  I have reviewed the triage vital signs and the nursing notes.  Pertinent labs & imaging results that were available during my care of the patient were reviewed by me and considered in my medical decision making (see chart for details).        Plan film demonstrates no acute obvious acute  pathology.  Patient will undergo CT imaging of the right knee for further evaluation.  Ice applied for the hematoma.  No use of anticoagulants.  Compartments are soft.  Normal pulses.  Clinical history does not sound consistent with dislocation relocation  4:31 PM Large associated soft tissue hematoma.  Neurovascularly intact.  Questionable nondisplaced fracture through the patella.  Extensor mechanism is intact.  Compressive treatment with Ace bandage and knee immobilizer.  Crutches.  Weightbearing as tolerated.  Outpatient orthopedic follow-up.  Compartments remain soft.  No indication for additional management or treatment at this time.  Home with a prescription for oxycodone.  Final Clinical Impressions(s) / ED Diagnoses   Final diagnoses:  None    ED Discharge Orders    None       Azalia Bilisampos, Denetra Formoso, MD 10/07/18 [phone]

## 2018-10-07 NOTE — ED Notes (Signed)
Pt verbalized understanding of discharge instructions and denies any further questions at this time. Pt understands instructions on medication administration.

## 2018-10-07 NOTE — ED Triage Notes (Signed)
Pt here with c/o right knee pain after falling in [place] , pt unable to bear weight on that leg , right knee is swollen

## 2018-10-07 NOTE — ED Notes (Signed)
ED Provider at bedside. 

## 2018-10-23 ENCOUNTER — Other Ambulatory Visit: Payer: Self-pay | Admitting: Orthopedic Surgery

## 2018-10-23 DIAGNOSIS — M25561 Pain in right knee: Secondary | ICD-10-CM

## 2018-11-21 ENCOUNTER — Ambulatory Visit
Admission: RE | Admit: 2018-11-21 | Discharge: 2018-11-21 | Disposition: A | Discharge status: Home / Self Care | Payer: Self-pay | Source: Ambulatory Visit | Attending: Orthopedic Surgery | Admitting: Orthopedic Surgery

## 2018-11-21 ENCOUNTER — Other Ambulatory Visit: Payer: Self-pay

## 2018-11-21 DIAGNOSIS — M25561 Pain in right knee: Secondary | ICD-10-CM

## 2018-12-25 NOTE — Pre-Procedure Instructions (Signed)
Althea GrimmerGeorge Vernon Montz Jr.  12/25/2018      [id number] [place], [place] - [address] [facility] OF [address] [place] [id number] Phone: [phone] Fax: [phone]    Your procedure is scheduled on December 28, 2018  [facility] Admitting at ___AM.  Call this number if you have problems the morning of surgery:  [phone]   Remember:  Do not eat or drink after midnight.  You may drink clear liquids until 3 hours prior to surgery start time.  Clear liquids allowed are:                    Water, Juice (non-citric and without pulp), Clear Tea, Black Coffee only and Gatorade  Enhanced Recovery after Surgery for Orthopedics Enhanced Recovery after Surgery is a protocol used to improve the stress on your body and your recovery after surgery.  Patient Instructions  . The night before surgery:  o No food after midnight. ONLY clear liquids after midnight  .  Marland Kitchen. The day of surgery (if you do NOT have diabetes):  o Drink ONE (1) Pre-Surgery Clear Ensure as directed.   o This drink was given to you during your hospital  pre-op appointment visit. o The pre-op nurse will instruct you on the time to drink the  Pre-Surgery Ensure depending on your surgery time. o Finish the drink at the designated time by the pre-op nurse.  o Nothing else to drink after completing the  Pre-Surgery Clear Ensure.  . The day of surgery (if you have diabetes): o  o Drink ONE (1) Gatorade 2 (G2) as directed. o This drink was given to you during your hospital  pre-op appointment visit.  o The pre-op nurse will instruct you on the time to drink the   Gatorade 2 (G2) depending on your surgery time. o Color of the Gatorade may vary. Red is not allowed. o Nothing else to drink after completing the  Gatorade 2 (G2).         If you have questions, please contact your surgeon's office.    Take these medicines the morning  of surgery with A SIP OF WATER  Tylenol-if needed Omeprazole (prilosec) Oxycodone-acetaminophen-if needed   Beginning now, STOP taking any Aspirin (unless otherwise instructed by your surgeon), Aleve, Naproxen, Ibuprofen, Motrin, Advil, Goody's, BC's, all herbal medications, fish oil, and all vitamins   Day of surgery:  Do not wear jewelry  Do not wear lotions, powders, or colognes, or deodorant.  Men may shave face and neck.  Do not bring valuables to the hospital.  [facility] not responsible for any belongings or valuables.  Contacts, dentures or bridgework may not be worn into surgery.  Leave your suitcase in the car.  After surgery it may be brought to your room.  For patients admitted to the hospital, discharge time will be determined by your treatment team.  Patients discharged the day of surgery will not be allowed to drive home.    [facility]- Preparing For Surgery  Before surgery, you can play an important role. Because skin is not sterile, your skin needs to be as free of germs as possible. You can reduce the number of germs on your skin by washing with CHG (chlorahexidine gluconate) Soap before surgery.  CHG is an antiseptic cleaner which kills germs and bonds with the skin to continue killing germs even  after washing.    Oral Hygiene is also important to reduce your risk of infection.  Remember - BRUSH YOUR TEETH THE MORNING OF SURGERY WITH YOUR REGULAR TOOTHPASTE  Please do not use if you have an allergy to CHG or antibacterial soaps. If your skin becomes reddened/irritated stop using the CHG.  Do not shave (including legs and underarms) for at least 48 hours prior to first CHG shower. It is OK to shave your face.  Please follow these instructions carefully.   1. Shower the NIGHT BEFORE SURGERY and the MORNING OF SURGERY with CHG.   2. If you chose to wash your hair, wash your hair first as usual with your normal shampoo.  3. After you shampoo, rinse your hair  and body thoroughly to remove the shampoo.  4. Use CHG as you would any other liquid soap. You can apply CHG directly to the skin and wash gently with a scrungie or a clean washcloth.   5. Apply the CHG Soap to your body ONLY FROM THE NECK DOWN.  Do not use on open wounds or open sores. Avoid contact with your eyes, ears, mouth and genitals (private parts). Wash Face and genitals (private parts)  with your normal soap.  6. Wash thoroughly, paying special attention to the area where your surgery will be performed.  7. Thoroughly rinse your body with warm water from the neck down.  8. DO NOT shower/wash with your normal soap after using and rinsing off the CHG Soap.  9. Pat yourself dry with a CLEAN TOWEL.  10. Wear CLEAN PAJAMAS to bed the night before surgery, wear comfortable clothes the morning of surgery  11. Place CLEAN SHEETS on your bed the night of your first shower and DO NOT SLEEP WITH PETS.  Day of Surgery: Shower as above Do not apply any deodorants/lotions.  Please wear clean clothes to the hospital/surgery center.   Remember to brush your teeth WITH YOUR REGULAR TOOTHPASTE.  Please read over the following fact sheets that you were given.

## 2018-12-26 ENCOUNTER — Inpatient Hospital Stay (HOSPITAL_COMMUNITY)
Admission: RE | Admit: 2018-12-26 | Discharge: 2018-12-26 | Disposition: A | Discharge status: Home / Self Care | Payer: Self-pay | Source: Ambulatory Visit

## 2018-12-26 ENCOUNTER — Inpatient Hospital Stay (HOSPITAL_COMMUNITY): Admission: RE | Admit: 2018-12-26 | Payer: Self-pay | Source: Ambulatory Visit

## 2018-12-26 NOTE — Progress Notes (Signed)
PCP -  Cardiologist -  Chest x-ray -  EKG -   Stress Test -  ECHO -   Cardiac Cath -   Sleep Study -  CPAP -   Fasting Blood Sugar -  Checks Blood Sugar _____ times a day  Blood Thinner Instructions: Aspirin Instructions:  Anesthesia review:   Patient denies shortness of breath, fever, cough and chest pain at PAT appointment  Patient verbalized understanding of instructions that were given to them at the PAT appointment. Patient was also instructed that they will need to review over the PAT instructions again at home before surgery. 

## 2019-01-03 ENCOUNTER — Other Ambulatory Visit (HOSPITAL_COMMUNITY)
Admission: RE | Admit: 2019-01-03 | Discharge: 2019-01-03 | Disposition: A | Discharge status: Home / Self Care | Payer: HRSA Program | Source: Ambulatory Visit | Attending: Orthopedic Surgery | Admitting: Orthopedic Surgery

## 2019-01-03 DIAGNOSIS — Z20828 Contact with and (suspected) exposure to other viral communicable diseases: Secondary | ICD-10-CM | POA: Insufficient documentation

## 2019-01-03 DIAGNOSIS — Z01812 Encounter for preprocedural laboratory examination: Secondary | ICD-10-CM | POA: Insufficient documentation

## 2019-01-04 ENCOUNTER — Other Ambulatory Visit: Payer: Self-pay

## 2019-01-04 ENCOUNTER — Encounter (HOSPITAL_COMMUNITY): Payer: Self-pay

## 2019-01-05 LAB — NOVEL CORONAVIRUS, NAA (HOSP ORDER, SEND-OUT TO REF LAB; TAT 18-24 HRS): SARS-CoV-2, NAA: NOT DETECTED

## 2019-01-07 ENCOUNTER — Ambulatory Visit (HOSPITAL_COMMUNITY): Payer: Self-pay | Admitting: Certified Registered Nurse Anesthetist

## 2019-01-07 ENCOUNTER — Encounter (HOSPITAL_COMMUNITY)
Admission: RE | Disposition: A | Discharge status: Home / Self Care | Payer: Self-pay | Source: Home / Self Care | Attending: Orthopedic Surgery

## 2019-01-07 ENCOUNTER — Ambulatory Visit (HOSPITAL_COMMUNITY)
Admission: RE | Admit: 2019-01-07 | Discharge: 2019-01-07 | Disposition: A | Discharge status: Home / Self Care | Payer: Self-pay | Attending: Orthopedic Surgery | Admitting: Orthopedic Surgery

## 2019-01-07 ENCOUNTER — Encounter (HOSPITAL_COMMUNITY): Payer: Self-pay | Admitting: Surgery

## 2019-01-07 ENCOUNTER — Other Ambulatory Visit: Payer: Self-pay

## 2019-01-07 DIAGNOSIS — K219 Gastro-esophageal reflux disease without esophagitis: Secondary | ICD-10-CM | POA: Insufficient documentation

## 2019-01-07 DIAGNOSIS — S83241A Other tear of medial meniscus, current injury, right knee, initial encounter: Secondary | ICD-10-CM | POA: Insufficient documentation

## 2019-01-07 DIAGNOSIS — I10 Essential (primary) hypertension: Secondary | ICD-10-CM | POA: Insufficient documentation

## 2019-01-07 DIAGNOSIS — W19XXXA Unspecified fall, initial encounter: Secondary | ICD-10-CM | POA: Insufficient documentation

## 2019-01-07 DIAGNOSIS — Z79899 Other long term (current) drug therapy: Secondary | ICD-10-CM | POA: Insufficient documentation

## 2019-01-07 DIAGNOSIS — Z882 Allergy status to sulfonamides status: Secondary | ICD-10-CM | POA: Insufficient documentation

## 2019-01-07 DIAGNOSIS — Z9884 Bariatric surgery status: Secondary | ICD-10-CM | POA: Insufficient documentation

## 2019-01-07 DIAGNOSIS — S83511A Sprain of anterior cruciate ligament of right knee, initial encounter: Secondary | ICD-10-CM | POA: Insufficient documentation

## 2019-01-07 DIAGNOSIS — M1711 Unilateral primary osteoarthritis, right knee: Secondary | ICD-10-CM | POA: Insufficient documentation

## 2019-01-07 DIAGNOSIS — S83281A Other tear of lateral meniscus, current injury, right knee, initial encounter: Secondary | ICD-10-CM | POA: Insufficient documentation

## 2019-01-07 DIAGNOSIS — M94261 Chondromalacia, right knee: Secondary | ICD-10-CM | POA: Insufficient documentation

## 2019-01-07 HISTORY — DX: Personal history of urinary calculi: Z87.442

## 2019-01-07 HISTORY — DX: Family history of other specified conditions: Z84.89

## 2019-01-07 HISTORY — PX: KNEE ARTHROSCOPY WITH MEDIAL MENISECTOMY: SHX5651

## 2019-01-07 HISTORY — DX: Other specified postprocedural states: Z98.890

## 2019-01-07 HISTORY — DX: Gastro-esophageal reflux disease without esophagitis: K21.9

## 2019-01-07 HISTORY — DX: Other specified postprocedural states: R11.2

## 2019-01-07 LAB — CBC WITH DIFFERENTIAL/PLATELET
Abs Immature Granulocytes: 0.02 10*3/uL (ref 0.00–0.07)
Basophils Absolute: 0.1 10*3/uL (ref 0.0–0.1)
Basophils Relative: 1 %
Eosinophils Absolute: 0.3 10*3/uL (ref 0.0–0.5)
Eosinophils Relative: 4 %
HCT: 41 % (ref 39.0–52.0)
Hemoglobin: 12.3 g/dL — ABNORMAL LOW (ref 13.0–17.0)
Immature Granulocytes: 0 %
Lymphocytes Relative: 29 %
Lymphs Abs: 2 10*3/uL (ref 0.7–4.0)
MCH: 24.8 pg — ABNORMAL LOW (ref 26.0–34.0)
MCHC: 30 g/dL (ref 30.0–36.0)
MCV: 82.7 fL (ref 80.0–100.0)
Monocytes Absolute: 0.7 10*3/uL (ref 0.1–1.0)
Monocytes Relative: 11 %
Neutro Abs: 3.8 10*3/uL (ref 1.7–7.7)
Neutrophils Relative %: 55 %
Platelets: 326 10*3/uL (ref 150–400)
RBC: 4.96 MIL/uL (ref 4.22–5.81)
RDW: 15.6 % — ABNORMAL HIGH (ref 11.5–15.5)
WBC: 6.9 10*3/uL (ref 4.0–10.5)
nRBC: 0 % (ref 0.0–0.2)

## 2019-01-07 LAB — COMPREHENSIVE METABOLIC PANEL
ALT: 19 U/L (ref 0–44)
AST: 25 U/L (ref 15–41)
Albumin: 4.3 g/dL (ref 3.5–5.0)
Alkaline Phosphatase: 48 U/L (ref 38–126)
Anion gap: 10 (ref 5–15)
BUN: 11 mg/dL (ref 6–20)
CO2: 26 mmol/L (ref 22–32)
Calcium: 9 mg/dL (ref 8.9–10.3)
Chloride: 102 mmol/L (ref 98–111)
Creatinine, Ser: 0.97 mg/dL (ref 0.61–1.24)
GFR calc Af Amer: >60 mL/min (ref >60)
GFR calc non Af Amer: >60 mL/min (ref >60)
Glucose, Bld: 95 mg/dL (ref 70–99)
Potassium: 3.3 mmol/L — ABNORMAL LOW (ref 3.5–5.1)
Sodium: 138 mmol/L (ref 135–145)
Total Bilirubin: 0.6 mg/dL (ref 0.3–1.2)
Total Protein: 7.5 g/dL (ref 6.5–8.1)

## 2019-01-07 LAB — PROTIME-INR
INR: 1 (ref 0.8–1.2)
Prothrombin Time: 13.1 seconds (ref 11.4–15.2)

## 2019-01-07 SURGERY — ARTHROSCOPY, KNEE, WITH MEDIAL MENISCECTOMY
Anesthesia: General | Site: Knee | Laterality: Right

## 2019-01-07 MED ORDER — FENTANYL CITRATE (PF) 100 MCG/2ML IJ SOLN
25.0000 ug | INTRAMUSCULAR | Status: DC | PRN
  Administered 2019-01-07: 50 ug via INTRAVENOUS

## 2019-01-07 MED ORDER — KETOROLAC TROMETHAMINE 10 MG PO TABS: 10.0000 mg | ORAL_TABLET | Freq: Four times a day (QID) | ORAL | 0 refills | Status: AC | PRN

## 2019-01-07 MED ORDER — FENTANYL CITRATE (PF) 100 MCG/2ML IJ SOLN
INTRAMUSCULAR | Status: DC | PRN
  Administered 2019-01-07 (×5): 50 ug via INTRAVENOUS

## 2019-01-07 MED ORDER — MIDAZOLAM HCL 2 MG/2ML IJ SOLN
INTRAMUSCULAR | Status: AC
  Filled 2019-01-07: qty 2

## 2019-01-07 MED ORDER — MIDAZOLAM HCL 5 MG/5ML IJ SOLN
INTRAMUSCULAR | Status: DC | PRN
  Administered 2019-01-07: 2 mg via INTRAVENOUS

## 2019-01-07 MED ORDER — OXYCODONE HCL 5 MG PO TABS
5.0000 mg | ORAL_TABLET | Freq: Once | ORAL | Status: AC | PRN
  Administered 2019-01-07: 5 mg via ORAL

## 2019-01-07 MED ORDER — FENTANYL CITRATE (PF) 100 MCG/2ML IJ SOLN
25.0000 ug | INTRAMUSCULAR | Status: DC | PRN
  Administered 2019-01-07 (×3): 50 ug via INTRAVENOUS

## 2019-01-07 MED ORDER — KETOROLAC TROMETHAMINE 30 MG/ML IJ SOLN: 30.0000 mg | Freq: Once | INTRAMUSCULAR | Status: DC

## 2019-01-07 MED ORDER — OXYCODONE-ACETAMINOPHEN 5-325 MG PO TABS: 1.0000 | ORAL_TABLET | Freq: Four times a day (QID) | ORAL | 0 refills | Status: AC | PRN

## 2019-01-07 MED ORDER — OXYCODONE HCL 5 MG PO TABS
ORAL_TABLET | ORAL | Status: AC
  Filled 2019-01-07: qty 1

## 2019-01-07 MED ORDER — PHENYLEPHRINE 40 MCG/ML (10ML) SYRINGE FOR IV PUSH (FOR BLOOD PRESSURE SUPPORT)
PREFILLED_SYRINGE | INTRAVENOUS | Status: DC | PRN
  Administered 2019-01-07 (×5): 80 ug via INTRAVENOUS

## 2019-01-07 MED ORDER — PHENYLEPHRINE 40 MCG/ML (10ML) SYRINGE FOR IV PUSH (FOR BLOOD PRESSURE SUPPORT)
PREFILLED_SYRINGE | INTRAVENOUS | Status: AC
  Filled 2019-01-07: qty 10

## 2019-01-07 MED ORDER — ONDANSETRON HCL 4 MG/2ML IJ SOLN
INTRAMUSCULAR | Status: DC | PRN
  Administered 2019-01-07: 4 mg via INTRAVENOUS

## 2019-01-07 MED ORDER — MORPHINE SULFATE (PF) 4 MG/ML IV SOLN
INTRAVENOUS | Status: DC | PRN
  Administered 2019-01-07: 4 mg via SUBCUTANEOUS

## 2019-01-07 MED ORDER — FENTANYL CITRATE (PF) 100 MCG/2ML IJ SOLN
INTRAMUSCULAR | Status: AC
  Filled 2019-01-07: qty 2

## 2019-01-07 MED ORDER — ONDANSETRON 4 MG PO TBDP: 4.0000 mg | ORAL_TABLET | Freq: Three times a day (TID) | ORAL | 0 refills | Status: AC | PRN

## 2019-01-07 MED ORDER — LIDOCAINE 2% (20 MG/ML) 5 ML SYRINGE
INTRAMUSCULAR | Status: DC | PRN
  Administered 2019-01-07: 60 mg via INTRAVENOUS

## 2019-01-07 MED ORDER — OXYCODONE HCL 5 MG/5ML PO SOLN: 5.0000 mg | Freq: Once | ORAL | Status: AC | PRN

## 2019-01-07 MED ORDER — DEXAMETHASONE SODIUM PHOSPHATE 10 MG/ML IJ SOLN
INTRAMUSCULAR | Status: AC
  Filled 2019-01-07: qty 1

## 2019-01-07 MED ORDER — FENTANYL CITRATE (PF) 250 MCG/5ML IJ SOLN
INTRAMUSCULAR | Status: AC
  Filled 2019-01-07: qty 5

## 2019-01-07 MED ORDER — LACTATED RINGERS IV SOLN
INTRAVENOUS | Status: DC
  Administered 2019-01-07 (×2): via INTRAVENOUS

## 2019-01-07 MED ORDER — ONDANSETRON HCL 4 MG/2ML IJ SOLN
INTRAMUSCULAR | Status: AC
  Filled 2019-01-07: qty 2

## 2019-01-07 MED ORDER — SCOPOLAMINE 1 MG/3DAYS TD PT72
MEDICATED_PATCH | TRANSDERMAL | Status: DC | PRN
  Administered 2019-01-07: 1 via TRANSDERMAL

## 2019-01-07 MED ORDER — CHLORHEXIDINE GLUCONATE 4 % EX LIQD: 60.0000 mL | Freq: Once | CUTANEOUS | Status: DC

## 2019-01-07 MED ORDER — MORPHINE SULFATE (PF) 4 MG/ML IV SOLN
INTRAVENOUS | Status: AC
  Filled 2019-01-07: qty 1

## 2019-01-07 MED ORDER — SODIUM CHLORIDE 0.9 % IR SOLN
Status: DC | PRN
  Administered 2019-01-07 (×3): 3000 mL

## 2019-01-07 MED ORDER — CEFAZOLIN SODIUM-DEXTROSE 2-4 GM/100ML-% IV SOLN
INTRAVENOUS | Status: AC
  Filled 2019-01-07: qty 100

## 2019-01-07 MED ORDER — LIDOCAINE 2% (20 MG/ML) 5 ML SYRINGE
INTRAMUSCULAR | Status: AC
  Filled 2019-01-07: qty 5

## 2019-01-07 MED ORDER — PROPOFOL 10 MG/ML IV BOLUS
INTRAVENOUS | Status: DC | PRN
  Administered 2019-01-07: 20 mg via INTRAVENOUS
  Administered 2019-01-07: 200 mg via INTRAVENOUS

## 2019-01-07 MED ORDER — SCOPOLAMINE 1 MG/3DAYS TD PT72
MEDICATED_PATCH | TRANSDERMAL | Status: AC
  Filled 2019-01-07: qty 1

## 2019-01-07 MED ORDER — ONDANSETRON HCL 4 MG/2ML IJ SOLN
4.0000 mg | Freq: Four times a day (QID) | INTRAMUSCULAR | Status: AC | PRN
  Administered 2019-01-07: 4 mg via INTRAVENOUS

## 2019-01-07 MED ORDER — ACETAMINOPHEN 500 MG PO TABS
1000.0000 mg | ORAL_TABLET | Freq: Once | ORAL | Status: AC
  Administered 2019-01-07: 1000 mg via ORAL
  Filled 2019-01-07: qty 2

## 2019-01-07 MED ORDER — BUPIVACAINE-EPINEPHRINE (PF) 0.25% -1:200000 IJ SOLN
INTRAMUSCULAR | Status: AC
  Filled 2019-01-07: qty 30

## 2019-01-07 MED ORDER — BUPIVACAINE-EPINEPHRINE 0.25% -1:200000 IJ SOLN
INTRAMUSCULAR | Status: DC | PRN
  Administered 2019-01-07: 12 mL
  Administered 2019-01-07: 5 mL

## 2019-01-07 MED ORDER — CEFAZOLIN SODIUM-DEXTROSE 2-4 GM/100ML-% IV SOLN
2.0000 g | INTRAVENOUS | Status: AC
  Administered 2019-01-07: 2 g via INTRAVENOUS

## 2019-01-07 MED ORDER — DEXAMETHASONE SODIUM PHOSPHATE 4 MG/ML IJ SOLN
INTRAMUSCULAR | Status: DC | PRN
  Administered 2019-01-07: 5 mg via INTRAVENOUS

## 2019-01-07 SURGICAL SUPPLY — 40 items
BANDAGE ESMARK 6X9 LF (GAUZE/BANDAGES/DRESSINGS) IMPLANT
BLADE SURG 11 STRL SS (BLADE) ×3 IMPLANT
BNDG ELASTIC 6X5.8 VLCR STR LF (GAUZE/BANDAGES/DRESSINGS) ×6 IMPLANT
BNDG ESMARK 6X9 LF (GAUZE/BANDAGES/DRESSINGS)
BRUSH SCRUB EZ PLAIN DRY (MISCELLANEOUS) ×6 IMPLANT
COVER SURGICAL LIGHT HANDLE (MISCELLANEOUS) ×6 IMPLANT
COVER WAND RF STERILE (DRAPES) IMPLANT
CUFF TOURN SGL QUICK 34 (TOURNIQUET CUFF) ×2
CUFF TOURN SGL QUICK 42 (TOURNIQUET CUFF) IMPLANT
CUFF TRNQT CYL 34X4.125X (TOURNIQUET CUFF) ×1 IMPLANT
DISSECTOR 4.0MM X 13CM (MISCELLANEOUS) ×3 IMPLANT
DRAPE ARTHROSCOPY W/POUCH 114 (DRAPES) ×3 IMPLANT
DRAPE U-SHAPE 47X51 STRL (DRAPES) ×3 IMPLANT
DRSG EMULSION OIL 3X3 NADH (GAUZE/BANDAGES/DRESSINGS) ×3 IMPLANT
DRSG PAD ABDOMINAL 8X10 ST (GAUZE/BANDAGES/DRESSINGS) ×6 IMPLANT
GAUZE 4X4 16PLY RFD (DISPOSABLE) ×3 IMPLANT
GAUZE SPONGE 4X4 12PLY STRL (GAUZE/BANDAGES/DRESSINGS) ×3 IMPLANT
GLOVE BIO SURGEON STRL SZ7.5 (GLOVE) ×3 IMPLANT
GLOVE BIO SURGEON STRL SZ8 (GLOVE) ×3 IMPLANT
GLOVE BIOGEL PI IND STRL 7.5 (GLOVE) ×1 IMPLANT
GLOVE BIOGEL PI IND STRL 8 (GLOVE) ×1 IMPLANT
GLOVE BIOGEL PI INDICATOR 7.5 (GLOVE) ×2
GLOVE BIOGEL PI INDICATOR 8 (GLOVE) ×2
GOWN STRL REUS W/ TWL LRG LVL3 (GOWN DISPOSABLE) ×3 IMPLANT
GOWN STRL REUS W/ TWL XL LVL3 (GOWN DISPOSABLE) ×1 IMPLANT
GOWN STRL REUS W/TWL LRG LVL3 (GOWN DISPOSABLE) ×6
GOWN STRL REUS W/TWL XL LVL3 (GOWN DISPOSABLE) ×2
KIT BASIN OR (CUSTOM PROCEDURE TRAY) ×3 IMPLANT
KIT TURNOVER KIT B (KITS) ×3 IMPLANT
MANIFOLD NEPTUNE II (INSTRUMENTS) ×3 IMPLANT
PACK ARTHROSCOPY DSU (CUSTOM PROCEDURE TRAY) ×3 IMPLANT
PAD ARMBOARD 7.5X6 YLW CONV (MISCELLANEOUS) ×6 IMPLANT
PADDING CAST COTTON 6X4 STRL (CAST SUPPLIES) ×3 IMPLANT
SUT ETHILON 4 0 PS 2 18 (SUTURE) ×3 IMPLANT
TOWEL GREEN STERILE FF (TOWEL DISPOSABLE) ×6 IMPLANT
TUBE CONNECTING 12'X1/4 (SUCTIONS) ×1
TUBE CONNECTING 12X1/4 (SUCTIONS) ×2 IMPLANT
TUBING ARTHROSCOPY IRRIG 16FT (MISCELLANEOUS) ×6 IMPLANT
WAND STAR VAC 90 (SURGICAL WAND) IMPLANT
WATER STERILE IRR 1000ML POUR (IV SOLUTION) ×3 IMPLANT

## 2019-01-07 NOTE — Anesthesia Postprocedure Evaluation (Signed)
Anesthesia Post Note  Patient: Gregory Blanchard.  Procedure(s) Performed: KNEE ARTHROSCOPY WITH MEDIAL MENISECTOMY (Right Knee)     Patient location during evaluation: PACU Anesthesia Type: General Level of consciousness: awake and alert and oriented Pain management: pain level controlled Vital Signs Assessment: post-procedure vital signs reviewed and stable Respiratory status: spontaneous breathing, nonlabored ventilation and respiratory function stable Cardiovascular status: blood pressure returned to baseline Postop Assessment: no apparent nausea or vomiting Anesthetic complications: no    Last Vitals:  Vitals:   01/07/19 1635 01/07/19 1650  BP: (!) 144/86 139/88  Pulse: 82 81  Resp: 13 18  Temp:    SpO2: 98% 99%    Last Pain:  Vitals:   01/07/19 1638  TempSrc:   PainSc: [employer]

## 2019-01-07 NOTE — Anesthesia Preprocedure Evaluation (Signed)
Anesthesia Evaluation  Patient identified by MRN, date of birth, ID band Patient awake    Reviewed: Allergy & Precautions, H&P , NPO status , Patient's Chart, lab work & pertinent test results  History of Anesthesia Complications (+) PONV and history of anesthetic complications  Airway Mallampati: II   Neck ROM: full    Dental   Pulmonary    breath sounds clear to auscultation       Cardiovascular hypertension,  Rhythm:regular Rate:Normal     Neuro/Psych Seizures -,     GI/Hepatic GERD  ,  Endo/Other    Renal/GU stones     Musculoskeletal   Abdominal   Peds  Hematology   Anesthesia Other Findings   Reproductive/Obstetrics                             Anesthesia Physical Anesthesia Plan  ASA: II  Anesthesia Plan: General   Post-op Pain Management:    Induction: Intravenous  PONV Risk Score and Plan: 3 and Ondansetron, Dexamethasone, Midazolam and Treatment may vary due to age or medical condition  Airway Management Planned: LMA  Additional Equipment:   Intra-op Plan:   Post-operative Plan:   Informed Consent: I have reviewed the patients History and Physical, chart, labs and discussed the procedure including the risks, benefits and alternatives for the proposed anesthesia with the patient or authorized representative who has indicated his/her understanding and acceptance.       Plan Discussed with: CRNA, Anesthesiologist and Surgeon  Anesthesia Plan Comments:         Anesthesia Quick Evaluation

## 2019-01-07 NOTE — Brief Op Note (Addendum)
01/07/2019  3:33 PM  PATIENT:  Prudencio Burly.  58 y.o. male  PRE-OPERATIVE DIAGNOSIS:   1. RIGHT MEDIAL AND LATERAL MENISCUS TEARS 2. CHONDROMALACIA  POST-OPERATIVE DIAGNOSIS:  1. RIGHT MEDIAL AND LATERAL MENISCUS TEARS\ 2. 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 KNEE ARTHROSCOPY WITH PARTIAL MEDIAL AND LATERAL MENISECTOMIES (Right) 2. EXTENSIVE CHONDROMALACIA MEDIAL COMPARTMENT 3. PARTIAL SYNOVECTOMY  SURGEON:  Surgeon(s) and Role:    * Altamese [place], MD - Primary  PHYSICIAN ASSISTANT: KEITH PAUL,PA-C  ANESTHESIA:   general  EBL:  Minimal   BLOOD ADMINISTERED:none  DRAINS: none   LOCAL MEDICATIONS USED:  LIDOCAINE   SPECIMEN:  No Specimen  DISPOSITION OF SPECIMEN:  N/A  COUNTS:  YES  TOURNIQUET:  none  DICTATION: [id number]  PLAN OF CARE: Discharge to home after PACU  PATIENT DISPOSITION:  PACU - hemodynamically stable.   Delay start of Pharmacological VTE agent (>24hrs) due to surgical blood loss or risk of bleeding: no

## 2019-01-07 NOTE — Anesthesia Procedure Notes (Signed)
Procedure Name: LMA Insertion Date/Time: 01/07/2019 2:11 PM Performed by: Alain Marion, CRNA Pre-anesthesia Checklist: Patient identified, Emergency Drugs available, Suction available and Patient being monitored Patient Re-evaluated:Patient Re-evaluated prior to induction Oxygen Delivery Method: Circle System Utilized Preoxygenation: Pre-oxygenation with 100% oxygen Induction Type: IV induction Ventilation: Mask ventilation without difficulty LMA: LMA inserted LMA Size: 5.0 Number of attempts: 1 Airway Equipment and Method: Bite block Placement Confirmation: positive ETCO2 Tube secured with: Tape Dental Injury: Teeth and Oropharynx as per pre-operative assessment

## 2019-01-07 NOTE — Discharge Instructions (Addendum)
[facility] Discharge Instructions   General Discharge Instructions  WEIGHT BEARING STATUS: Weightbearing as tolerated with assistance (crutches)  RANGE OF MOTION/ACTIVITY: unrestricted range of motion Right knee   Wound Care: daily wound care starting on 01/10/2019. See below  Discharge Wound Care Instructions  Do NOT apply any ointments, solutions or lotions to pin sites or surgical wounds.  These prevent needed drainage and even though solutions like hydrogen peroxide kill bacteria, they also damage cells lining the pin sites that help fight infection.  Applying lotions or ointments can keep the wounds moist and can cause them to breakdown and open up as well. This can increase the risk for infection. When in doubt call the office.  Surgical incisions should be dressed daily.  If any drainage is noted, use one layer of adaptic, then gauze, Kerlix, and an ace wrap.  Once the incision is completely dry and without drainage, it may be left open to air out.  Showering may begin 36-48 hours later.  Cleaning gently with soap and water.   Diet: as you were eating previously.  Can use over the counter stool softeners and bowel preparations, such as Miralax, to help with bowel movements.  Narcotics can be constipating.  Be sure to drink plenty of fluids  PAIN MEDICATION USE AND EXPECTATIONS  You have likely been given narcotic medications to help control your pain.  After a traumatic event that results in an fracture (broken bone) with or without surgery, it is ok to use narcotic pain medications to help control one's pain.  We understand that everyone responds to pain differently and each individual patient will be evaluated on a regular basis for the continued need for narcotic medications. Ideally, narcotic medication use should last no more than 6-8 weeks (coinciding with fracture healing).   As a patient it is your responsibility as well to monitor narcotic medication use  and report the amount and frequency you use these medications when you come to your office visit.   We would also advise that if you are using narcotic medications, you should take a dose prior to therapy to maximize you participation.  IF YOU ARE ON NARCOTIC MEDICATIONS IT IS NOT PERMISSIBLE TO OPERATE A MOTOR VEHICLE (MOTORCYCLE/CAR/TRUCK/MOPED) OR HEAVY MACHINERY DO NOT MIX NARCOTICS WITH OTHER CNS (CENTRAL NERVOUS SYSTEM) DEPRESSANTS SUCH AS ALCOHOL   STOP SMOKING OR USING NICOTINE PRODUCTS!!!!  As discussed nicotine severely impairs your body's ability to heal surgical and traumatic wounds but also impairs bone healing.  Wounds and bone heal by forming microscopic blood vessels (angiogenesis) and nicotine is a vasoconstrictor (essentially, shrinks blood vessels).  Therefore, if vasoconstriction occurs to these microscopic blood vessels they essentially disappear and are unable to deliver necessary nutrients to the healing tissue.  This is one modifiable factor that you can do to dramatically increase your chances of healing your injury.    (This means no smoking, no nicotine gum, patches, etc)   ICE AND ELEVATE INJURED/OPERATIVE EXTREMITY  Using ice and elevating the injured extremity above your heart can help with swelling and pain control.  Icing in a pulsatile fashion, such as 20 minutes on and 20 minutes off, can be followed.    Do not place ice directly on skin. Make sure there is a barrier between to skin and the ice pack.    Using frozen items such as frozen peas works well as the conform nicely to the are that needs to be iced.  USE AN ACE  WRAP OR TED HOSE FOR SWELLING CONTROL  In addition to icing and elevation, Ace wraps or TED hose are used to help limit and resolve swelling.  It is recommended to use Ace wraps or TED hose until you are informed to stop.    When using Ace Wraps start the wrapping distally (farthest away from the body) and wrap proximally (closer to the  body)   Example: If you had surgery on your leg or thing and you do not have a splint on, start the ace wrap at the toes and work your way up to the thigh        If you had surgery on your upper extremity and do not have a splint on, start the ace wrap at your fingers and work your way up to the upper arm  IF YOU ARE IN A SPLINT OR CAST DO NOT REMOVE IT FOR ANY REASON   If your splint gets wet for any reason please contact the office immediately. You may shower in your splint or cast as long as you keep it dry.  This can be done by wrapping in a cast cover or garbage back (or similar)  Do Not stick any thing down your splint or cast such as pencils, money, or hangers to try and scratch yourself with.  If you feel itchy take benadryl as prescribed on the bottle for itching  IF YOU ARE IN A CAM BOOT (BLACK BOOT)  You may remove boot periodically. Perform daily dressing changes as noted below.  Wash the liner of the boot regularly and wear a sock when wearing the boot. It is recommended that you sleep in the boot until told otherwise    Call office for the following:  Temperature greater than 101F  Persistent nausea and vomiting  Severe uncontrolled pain  Redness, tenderness, or signs of infection (pain, swelling, redness, odor or green/yellow discharge around the site)  Difficulty breathing, headache or visual disturbances  Hives  Persistent dizziness or light-headedness  Extreme fatigue  Any other questions or concerns you may have after discharge  In an emergency, call 911 or go to an Emergency Department at a nearby hospital    CALL THE OFFICE WITH ANY QUESTIONS OR CONCERNS: [phone]   VISIT OUR WEBSITE FOR ADDITIONAL INFORMATION: [email or web address]

## 2019-01-07 NOTE — Transfer of Care (Signed)
Immediate Anesthesia Transfer of Care Note  Patient: Gregory Blanchard.  Procedure(s) Performed: KNEE ARTHROSCOPY WITH MEDIAL MENISECTOMY (Right Knee)  Patient Location: PACU  Anesthesia Type:General  Level of Consciousness: awake, alert  and oriented  Airway & Oxygen Therapy: Patient Spontanous Breathing and Patient connected to face mask oxygen  Post-op Assessment: Report given to RN and Post -op Vital signs reviewed and stable  Post vital signs: Reviewed and stable  Last Vitals:  Vitals Value Taken Time  BP 143/106 01/07/19 1535  Temp 36.6 C 01/07/19 1535  Pulse 95 01/07/19 1543  Resp 14 01/07/19 1543  SpO2 100 % 01/07/19 1543  Vitals shown include unvalidated device data.  Last Pain:  Vitals:   01/07/19 1540  TempSrc:   PainSc: 8       Patients Stated Pain Goal: 3 (81/82/99 3716)  Complications: No apparent anesthesia complications

## 2019-01-07 NOTE — H&P (Addendum)
Orthopaedic Trauma Service (OTS) Consult   Patient ID: Gregory GrimmerGeorge Vernon Milich Jr. MRN: [id number] DOB/AGE: 1960/05/10 58 y.o.    HPI: Gregory GrimmerGeorge Vernon Watling Jr. is an 58 y.o. male who sustained a right knee and lower leg injury after fall.  Patient seen and evaluated in the emergency department had x-rays and CT scan performed which were initially notable for a nondisplaced vertical inferior pole patella fracture along with some soft tissue injury.  Patient was followed very closely in the outpatient setting.  He had serial aspirations for large hematoma in his thigh soft tissue.  Patient continued to worsen and we did attempt to obtain an MRI and in the early window however there was some delay and finally obtaining the MRI from insurance standpoint.  Ultimately the MRI was completed which demonstrated a significant internal derangement to the right knee including bilateral meniscal tears, ACL tear which is likely chronic and advanced tricompartmental DJD.  Decision was made to proceed to the OR for right knee arthroscopy.  Patient presents today for right knee arthroscopy.  Patient has failed all conservative measures  Past Medical History:  Diagnosis Date  . Family history of adverse reaction to anesthesia    mother- difficulty waking   . GERD (gastroesophageal reflux disease)   . History of kidney stones   . Hypertension   . PONV (postoperative nausea and vomiting)   . Seizures (HCC)     Past Surgical History:  Procedure Laterality Date  . APPENDECTOMY    . CHOLECYSTECTOMY    . EYE SURGERY    . GASTRIC BYPASS  2006  . KNEE SURGERY Right   . LITHOTRIPSY    . ROTATOR CUFF REPAIR Left   . TONSILLECTOMY      History reviewed. No pertinent family history.  Social History:  reports that he has never smoked. He has never used smokeless tobacco. He reports that he does not drink alcohol or use drugs.  Allergies:  Allergies  Allergen Reactions  . Sulfa Antibiotics  Shortness Of Breath and Swelling    tongue    Medications:  I have reviewed the patient's current medications. Prior to Admission:  Medications Prior to Admission  Medication Sig Dispense Refill Last Dose  . acetaminophen (TYLENOL) 500 MG tablet Take 1,000-1,500 mg by mouth every 6 (six) hours as needed (for pain.).   01/06/2019 at Unknown time  . naproxen sodium (ALEVE) 220 MG tablet Take 220-440 mg by mouth 2 (two) times daily as needed (pain.).   Past Week at Unknown time  . omeprazole (PRILOSEC) 20 MG capsule Take 20-40 mg by mouth daily.    01/07/2019 at 0830  . oxyCODONE-acetaminophen (PERCOCET/ROXICET) 5-325 MG tablet Take 1 tablet by mouth every 4 (four) hours as needed for severe pain. 15 tablet 0 Past Month at Unknown time  . ondansetron (ZOFRAN ODT) 8 MG disintegrating tablet Take 1 tablet (8 mg total) by mouth every 8 (eight) hours as needed for nausea or vomiting. (Patient not taking: Reported on 12/21/2018) 10 tablet 0 Not Taking at Unknown time    Results for orders placed or performed during the hospital encounter of 01/07/19 (from the past 48 hour(s))  CBC WITH DIFFERENTIAL     Status: Abnormal   Collection Time: 01/07/19  9:41 AM  Result Value Ref Range   WBC 6.9 4.0 - 10.5 K/uL   RBC 4.96 4.22 - 5.81 MIL/uL   Hemoglobin 12.3 (L) 13.0 - 17.0 g/dL   HCT  41.0 39.0 - 52.0 %   MCV 82.7 80.0 - 100.0 fL   MCH 24.8 (L) 26.0 - 34.0 pg   MCHC 30.0 30.0 - 36.0 g/dL   RDW 15.6 (H) 11.5 - 15.5 %   Platelets 326 150 - 400 K/uL   nRBC 0.0 0.0 - 0.2 %   Neutrophils Relative % 55 %   Neutro Abs 3.8 1.7 - 7.7 K/uL   Lymphocytes Relative 29 %   Lymphs Abs 2.0 0.7 - 4.0 K/uL   Monocytes Relative 11 %   Monocytes Absolute 0.7 0.1 - 1.0 K/uL   Eosinophils Relative 4 %   Eosinophils Absolute 0.3 0.0 - 0.5 K/uL   Basophils Relative 1 %   Basophils Absolute 0.1 0.0 - 0.1 K/uL   Immature Granulocytes 0 %   Abs Immature Granulocytes 0.02 0.00 - 0.07 K/uL    Comment: Performed at [facility] [address], [place], [place] [address]  Comprehensive metabolic panel     Status: Abnormal   Collection Time: 01/07/19  9:41 AM  Result Value Ref Range   Sodium 138 135 - 145 mmol/L   Potassium 3.3 (L) 3.5 - 5.1 mmol/L   Chloride 102 98 - 111 mmol/L   CO2 26 22 - 32 mmol/L   Glucose, Bld 95 70 - 99 mg/dL   BUN 11 6 - 20 mg/dL   Creatinine, Ser 0.97 0.61 - 1.24 mg/dL   Calcium 9.0 8.9 - 10.3 mg/dL   Total Protein 7.5 6.5 - 8.1 g/dL   Albumin 4.3 3.5 - 5.0 g/dL   AST 25 15 - 41 U/L   ALT 19 0 - 44 U/L   Alkaline Phosphatase 48 38 - 126 U/L   Total Bilirubin 0.6 0.3 - 1.2 mg/dL   GFR calc non Af Amer >60 >60 mL/min   GFR calc Af Amer >60 >60 mL/min   Anion gap 10 5 - 15    Comment: Performed at [facility], [place] [address] [place], [place] [address]  Protime-INR     Status: None   Collection Time: 01/07/19  9:41 AM  Result Value Ref Range   Prothrombin Time 13.1 11.4 - 15.2 seconds   INR 1.0 0.8 - 1.2    Comment: (NOTE) INR goal varies based on device and disease states. Performed at [facility], [place] [address], [place], [place] [address]     No results found.  Review of Systems  Constitutional: Negative for chills and fever.  Respiratory: Negative for shortness of breath and wheezing.   Cardiovascular: Negative for chest pain and palpitations.  Gastrointestinal: Negative for nausea and vomiting.  Musculoskeletal: Positive for joint pain (R knee pain ).  Neurological: Negative for tingling and sensory change.   Blood pressure (!) 156/92, pulse 81, temperature 98.1 F (36.7 C), temperature source Oral, SpO2 100 %. Physical Exam Constitutional:      Appearance: Normal appearance.  Cardiovascular:     Rate and Rhythm: Normal rate and regular rhythm.     Heart sounds: S1 normal and S2 normal.  Pulmonary:     Effort: Pulmonary effort is normal. No accessory muscle usage or respiratory distress.  Abdominal:     Comments: NTND, + BS    Musculoskeletal:     Comments: Right Lower Extremity Mild knee effusion  + joint line pain  Distal motor and sensory functions intact Restricted R knee ROM  Ext warm + DP pulse Compartments soft   Neurological:  Mental Status: He is alert.  Psychiatric:        Attention and Perception: Perception normal.        Mood and Affect: Mood and affect normal.        Behavior: Behavior is cooperative.       Assessment/Plan:  58 year old male with right knee internal derangement, multiple findings on MRI  -Right knee internal derangement, medial lateral meniscal tears, ACL tear, advanced tricompartmental DJD  OR for right knee arthroscopy  Anticipate discharge home after surgery  Will likely be weightbearing as tolerated with crutches  Risks and benefits reviewed with patient, he wishes to proceed  - Dispo:  OR to address R knee injury     Mearl Latin, PA-C [phone] (C) 01/07/2019, 11:20 AM  [facility] [address] [place] [address] [phone] [phone] (F)   I have seen and examined the patient. I agree with the findings above.  I discussed with the patient the risks and benefits of arthroscopic right knee surgery, including the possibility of infection, nerve injury, vessel injury, wound breakdown, arthritis, symptomatic hardware, DVT/ PE, loss of motion, malunion, nonunion, and need for further surgery among others.  We also specifically discussed the presence of arthritis and eventual possibility of total knee replacement.  He acknowledged these risks and wished to proceed.   Budd Palmer, MD 01/07/2019 1:56 PM

## 2019-01-08 ENCOUNTER — Encounter (HOSPITAL_COMMUNITY): Payer: Self-pay | Admitting: Orthopedic Surgery

## 2019-01-08 NOTE — Op Note (Signed)
NAME: Harrietta GuardianDAMS JR., [facility] MEDICAL RECORD [id number] ACCOUNT [id number] DATE OF BIRTH:[birth year] FACILITY: [facility] LOCATION: [facility] PHYSICIAN:Trenese Haft H. Kassaundra Hair, MD  OPERATIVE REPORT  DATE OF PROCEDURE:  01/07/2019  PREOPERATIVE DIAGNOSES: 1.  Right medial and lateral meniscus tears. 2.  Traumatic chondromalacia.  POSTOPERATIVE DIAGNOSES: 1.  Right medial and lateral meniscus tears. 2.  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  PROCEDURES: 1.  Arthroscopic right knee partial medial and lateral meniscectomies. 2.  Chondroplasty medial compartment. 3.  Partial synovectomy.  SURGEON:  Myrene GalasMichael Stephano Arrants, MD  ASSISTANT:  Montez MoritaKeith Paul, PA-C  ANESTHESIA:  General.  ESTIMATED BLOOD LOSS:  Minimal.  DISPOSITION:  To PACU.  CONDITION:  Stable.  BRIEF SUMMARY OF INDICATION FOR PROCEDURE:  The patient is a 58 year old male who sustained a fall resulting in severe right knee pain.  The patient eventually underwent MRI which demonstrated medial and lateral meniscal tears as well as significant  chondral change.  The patient underwent injection and therapy program in an attempt to mediate his symptoms, which remained rather severe and associated with mechanical symptoms of locking and giving way.  I discussed with him the possibility of total  knee arthroplasty and recommended that he consider it.  However, he was not willing to do so at his young age at this time and also for financial reasons and constraints.  He was hopeful to return to his pre-fall level of activity.  Other risks discussed  included anesthesia, nerve injury, vessel injury, recurrence of symptoms and DVT, PE.  BRIEF SUMMARY OF PROCEDURE:  The patient was taken to the operating room where general anesthesia was induced.  His right lower extremity was prepped and draped in the usual sterile fashion.  No tourniquet was used during the procedure.  After a  time-out, I  injected the area of incision of the medial and lateral arthroscopic portal sites with lidocaine with epinephrine, and then proceeded with making these portals and performing a thorough diagnostic arthroscopy.  There were no loose bodies in  the medial or lateral gutters.  There was a small plica, which did not make contact with the tibial condyle.  It was therefore not associated with his symptoms.  As I continued onto the anterior face of the medial femoral condyle, there was marked loss  of cartilage with wide grade IV changes and eburnated bone.  The medial meniscus had tearing associated with it that extended from the periphery back into the posterior aspect.  I did perform meniscal shaving to prevent any of these torn areas from being  able to reach into the joint between the surfaces.  However, I was probing the cartilage and then identified huge flaps of torn cartilage that were clearly catching during motion and knee bending.  These were debrided back with the arthroscopic shaver  by performing a chondroplasty to stable areas.  We followed with careful evaluation of these using the arthroscopic Freer and did not identify any further propagation.  However, these were again large and rather severe.  There were arthritic changes over  the tibia as well, but more consistent with a grade III.  The ACL appeared to be nearly shredded with a small wisp only remaining and multiple torn fibers and no ligamentum in front of it.  As we entered the lateral compartment, there was fortunately a  much better-appearing surface, both with regard to the anterior lateral femoral condyle, which appeared essentially pristine by comparison, and the vast majority of  the lateral meniscus; however, there was a posterior medial tear that we were able to  access along the eminence, and this was coming out onto the joint and was contributing to some articular irritation in that area as well.  Along the front of the knee, there  was extensive fibrosis, and so a partial synovectomy was performed, removing all  of these fronds that could extend and catch into the articular surfaces on the medial and lateral sides anteriorly.  I did avoid further synovectomy up in the suprapatellar pouch to reduce postoperative bleeding.    The patella was in surprisingly good condition without major articular loss, although there were some grade II and III changes in small areas.  No loose cartilage flaps.  I did switch portals and reevaluated along the trochlea and further performed a  chondroplasty of the trochlea and medial femoral condyle from both positions to make certain that we had removed all the loose cartilage flaps.  I was able to access the areas that required chondral shaving for grade III changes on the patella in this  manner and also to confirm adequate resection of the meniscal tears.  The arthroscopic instruments were removed from the knee and then the portals closed with injection of Marcaine and morphine.  The patient was taken to the PACU in stable condition   Ainsley Spinner, PA-C, was present and assisted me throughout with controlling the knee flexion to allow me to access these areas of chondromalacia effectively.  PROGNOSIS:  The patient has considerable articular injury, particularly in the medial compartment, and the next intervention could certainly be a total knee arthroplasty.  I am hopeful removal of these impediments to proper mechanical functioning of his  knee will allow for some pain and inflammation resolution and allow him to return to preinjury function.  We will most likely supplement his rehabilitation with injection with a steroid injection when he returns to the office for followup either at 2  weeks or 6 weeks.  LN/NUANCE  D:01/08/2019 T:01/08/2019 JOB:[id number]

## 2019-01-30 ENCOUNTER — Other Ambulatory Visit: Payer: Self-pay

## 2019-01-30 ENCOUNTER — Encounter: Payer: Self-pay | Admitting: Physical Therapy

## 2019-01-30 ENCOUNTER — Ambulatory Visit: Payer: Self-pay | Attending: Orthopedic Surgery | Admitting: Physical Therapy

## 2019-01-30 DIAGNOSIS — G8929 Other chronic pain: Secondary | ICD-10-CM | POA: Insufficient documentation

## 2019-01-30 DIAGNOSIS — R2689 Other abnormalities of gait and mobility: Secondary | ICD-10-CM | POA: Insufficient documentation

## 2019-01-30 DIAGNOSIS — M25561 Pain in right knee: Secondary | ICD-10-CM | POA: Insufficient documentation

## 2019-01-30 DIAGNOSIS — M62830 Muscle spasm of back: Secondary | ICD-10-CM | POA: Insufficient documentation

## 2019-01-30 DIAGNOSIS — M545 Low back pain: Secondary | ICD-10-CM | POA: Insufficient documentation

## 2019-01-30 DIAGNOSIS — M6281 Muscle weakness (generalized): Secondary | ICD-10-CM | POA: Insufficient documentation
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
## 2019-02-07 ENCOUNTER — Encounter: Payer: Self-pay | Admitting: Physical Therapy

## 2019-02-07 ENCOUNTER — Other Ambulatory Visit: Payer: Self-pay

## 2019-02-07 ENCOUNTER — Ambulatory Visit: Payer: Self-pay | Admitting: Physical Therapy

## 2019-02-07 DIAGNOSIS — G8929 Other chronic pain: Secondary | ICD-10-CM

## 2019-02-07 DIAGNOSIS — R2689 Other abnormalities of gait and mobility: Secondary | ICD-10-CM

## 2019-02-07 DIAGNOSIS — M25561 Pain in right knee: Secondary | ICD-10-CM

## 2019-02-07 DIAGNOSIS — M6281 Muscle weakness (generalized): Secondary | ICD-10-CM

## 2019-02-07 NOTE — Therapy (Signed)
[place], [place], [address] Phone: [phone]   Fax:  [phone]  Physical Therapy Treatment  Patient Details  Name: Gregory Blanchard. MRN: [id number] Date of Birth: [birth year] Referring Provider (PT):  Ainsley Spinner, [place]   Encounter Date: 02/07/2019  PT End of Session - 02/07/19 0807    Visit Number  2    Number of Visits  17    Date for PT Re-Evaluation  03/27/19    Authorization Type  Self Pay    PT Start Time  8841    PT Stop Time  0805    PT Time Calculation (min)  48 min       Past Medical History:  Diagnosis Date  . Family history of adverse reaction to anesthesia    mother- difficulty waking   . GERD (gastroesophageal reflux disease)   . History of kidney stones   . Hypertension   . PONV (postoperative nausea and vomiting)   . Seizures ([place])     Past Surgical History:  Procedure Laterality Date  . APPENDECTOMY    . CHOLECYSTECTOMY    . EYE SURGERY    . GASTRIC BYPASS  2006  . KNEE ARTHROSCOPY WITH MEDIAL MENISECTOMY Right 01/07/2019   Procedure: KNEE ARTHROSCOPY WITH MEDIAL MENISECTOMY;  Surgeon: Altamese Allen, MD;  Location: [place];  Service: Orthopedics;  Laterality: Right;  . KNEE SURGERY Right   . LITHOTRIPSY    . ROTATOR CUFF REPAIR Left   . TONSILLECTOMY      There were no vitals filed for this visit.  Subjective Assessment - 02/07/19 0719    Subjective  Pt reports he is using crutch to go up his stairs. He enters with [facility] in clinic today.    Currently in Pain?  Yes    Pain Score  4     Pain Location  Knee    Pain Orientation  Right    Pain Descriptors / Indicators  Aching;Sharp    Pain Type  Surgical pain    Aggravating Factors   prolonged, standing/walking, bending , stairs, prolonged sitting    Pain Relieving Factors  resing, ice, meds prn , shower         [facility] PT Assessment - 02/07/19 0001      AROM   Right Knee Extension  3    Right Knee Flexion   100   AAROM with strap                   [facility] Adult PT Treatment/Exercise - 02/07/19 0001      Knee/Hip Exercises: Stretches   Active Hamstring Stretch  3 reps;30 seconds      Knee/Hip Exercises: Aerobic   Nustep  L3 reduced to L1 due to pain. x 5 minutes       Knee/Hip Exercises: Standing   Heel Raises  10 reps    Hip Abduction  10 reps    Other Standing Knee Exercises  weight shifting forward and back , lateral       Knee/Hip Exercises: Seated   Long Arc Quad  15 reps    Hamstring Curl  15 reps    Hamstring Limitations  red band       Knee/Hip Exercises: Supine   Quad Sets  2 sets;15 reps    Quad Sets Limitations  towel in poplitieal space for feedback, also 15 x with ball squeeze     Short Arc Quad Sets  15  reps    Heel Slides  10 reps    Heel Slides Limitations  strap assist     Straight Leg Raises  10 reps      Knee/Hip Exercises: Sidelying   Hip ABduction  15 reps      Vasopneumatic   Number Minutes Vasopneumatic   10 minutes    Vasopnuematic Location   Knee    Vasopneumatic Pressure  Medium    Vasopneumatic Temperature   34               PT Short Term Goals - 01/30/19 1417      PT SHORT TERM GOAL #1   Title  pt to be I with inital HEP    Time  4    Period  Weeks    Status  New    Target Date  02/27/19      PT SHORT TERM GOAL #2   Title  pt to be able to utilize heel strike/ toe off gait pattern to promote gait efficency with LRAD with </= 4/10 pain    Time  4    Period  Weeks    Status  New    Target Date  02/27/19      PT SHORT TERM GOAL #3   Title  increase R knee flexion to >/= 105 degrees to promote funtional knee ROM    Time  4    Period  Weeks    Status  New    Target Date  02/27/19        PT Long Term Goals - 01/30/19 1419      PT LONG TERM GOAL #1   Title  increase R knee ROM to >/= 2 - 120 with </= 2/10 pain for functoinal ROM required for functional and efficent gait    Time  8    Period  Weeks    Status   New    Target Date  03/27/19      PT LONG TERM GOAL #2   Title  pt to be able to sit/ stand and walk >/= 60 min and navigate up/dwon >/= 18 steps with LRAD for functional mobility and endurance for work related activities    Time  8    Period  Weeks    Status  New    Target Date  03/27/19      PT LONG TERM GOAL #3   Title  increase R knee strength to >/= 4+/5 to promote stability with walking/ standing    Time  8    Period  Weeks    Status  New    Target Date  03/27/19      PT LONG TERM GOAL #4   Title  increase FOTO score to </= 40% limited to demo improvement in function    Time  8    Period  Weeks    Status  New    Target Date  03/27/19      PT LONG TERM GOAL #5   Title  pt to be I with all HEP given as of last visit to maintain current level of function    Time  8    Period  Weeks    Status  New    Target Date  03/27/19            Plan - 02/07/19 0813    Clinical Impression Statement  [facility] Education provided on the correct hand placement for gait  mechanics.Nustep on Level 3 painful to knee so decreased to Level 1. Focused session on reviewing HEP. He has increased pain with most therex. Vaso used at end of session to decrease pain and edema.    PT Next Visit Plan  review/ update HEP, knee stretching and ROM, patellar mobility, CKC strengthening/ progression, vaso for swelling    PT Home Exercise Plan  standing lateral weight shifting, forward weight shifting, sidelying hip abduction, quad set with ball squeeze, heel slide with strap       Patient will benefit from skilled therapeutic intervention in order to improve the following deficits and impairments:  Improper body mechanics, Postural dysfunction, Pain, Decreased balance, Decreased activity tolerance, Abnormal gait, Decreased strength, Increased edema, Increased fascial restricitons, Decreased endurance, Decreased range of motion, Obesity  Visit Diagnosis: Chronic pain of right knee  Muscle  weakness (generalized)  Other abnormalities of gait and mobility     Problem List There are no active problems to display for this patient.   Sherrie Mustache, [place] 02/07/2019, 8:19 AM  [facility] [address] [place], [place], [address] Phone: [phone]   Fax:  [phone]  Name: Gregory Blanchard. MRN: [id number] Date of Birth: 1960-06-28

## 2019-02-08 ENCOUNTER — Encounter

## 2019-02-11 ENCOUNTER — Ambulatory Visit: Payer: Self-pay | Admitting: Physical Therapy

## 2019-02-11 ENCOUNTER — Telehealth: Payer: Self-pay | Admitting: Physical Therapy

## 2019-02-11 NOTE — Telephone Encounter (Signed)
LVM regarding missed appointment today and noted when his scheduled appointment time is. If he is unable to attend to call us and we can cancel or reschedule that appointment for him.

## 2019-02-14 ENCOUNTER — Ambulatory Visit: Payer: Self-pay | Admitting: Physical Therapy

## 2019-02-14 ENCOUNTER — Encounter: Payer: Self-pay | Admitting: Physical Therapy

## 2019-02-14 ENCOUNTER — Other Ambulatory Visit: Payer: Self-pay

## 2019-02-14 DIAGNOSIS — M6281 Muscle weakness (generalized): Secondary | ICD-10-CM

## 2019-02-14 DIAGNOSIS — M25561 Pain in right knee: Secondary | ICD-10-CM

## 2019-02-14 DIAGNOSIS — R2689 Other abnormalities of gait and mobility: Secondary | ICD-10-CM

## 2019-02-14 DIAGNOSIS — G8929 Other chronic pain: Secondary | ICD-10-CM

## 2019-02-14 NOTE — Therapy (Signed)
[place], [place], [address] Phone: [phone]   Fax:  [phone]  Physical Therapy Treatment  Patient Details  Name: Gregory Blanchard. MRN: [id number] Date of Birth: 05-26-1960 Referring Provider (PT):  Ainsley Spinner, [place]   Encounter Date: 02/14/2019  PT End of Session - 02/14/19 1539    Visit Number  3    Number of Visits  17    Date for PT Re-Evaluation  03/27/19    Authorization Type  Self Pay    PT Start Time  1502    PT Stop Time  1543    PT Time Calculation (min)  41 min    Activity Tolerance  Patient tolerated treatment well    Behavior During Therapy  [facility] for tasks assessed/performed       Past Medical History:  Diagnosis Date  . Family history of adverse reaction to anesthesia    mother- difficulty waking   . GERD (gastroesophageal reflux disease)   . History of kidney stones   . Hypertension   . PONV (postoperative nausea and vomiting)   . Seizures ([place])     Past Surgical History:  Procedure Laterality Date  . APPENDECTOMY    . CHOLECYSTECTOMY    . EYE SURGERY    . GASTRIC BYPASS  2006  . KNEE ARTHROSCOPY WITH MEDIAL MENISECTOMY Right 01/07/2019   Procedure: KNEE ARTHROSCOPY WITH MEDIAL MENISECTOMY;  Surgeon: Altamese [place], MD;  Location: [place];  Service: Orthopedics;  Laterality: Right;  . KNEE SURGERY Right   . LITHOTRIPSY    . ROTATOR CUFF REPAIR Left   . TONSILLECTOMY      There were no vitals filed for this visit.  Subjective Assessment - 02/14/19 1509    Subjective  "I am noticing increased soreness along the inside of the knee which seems to be getting worse"    Currently in Pain?  Yes    Pain Score  7     Pain Location  Knee    Pain Orientation  Right    Pain Descriptors / Indicators  Aching    Pain Type  Chronic pain    Pain Onset  More than a month ago    Pain Frequency  Intermittent         [facility] PT Assessment - 02/14/19 0001      Assessment    Medical Diagnosis  R knee Arthroscopic menisectomy    Referring Provider (PT)   Ainsley Spinner, PA-C    Onset Date/Surgical Date  01/07/19                   [facility] Adult PT Treatment/Exercise - 02/14/19 0001      Knee/Hip Exercises: Stretches   Active Hamstring Stretch  3 reps;30 seconds;Right      Knee/Hip Exercises: Aerobic   Nustep  L5 x 33mn UE/LE      Knee/Hip Exercises: Supine   Short Arc Quad Sets  Strengthening;2 sets;15 reps;Both;Right   with ball squeeze   Straight Leg Raises  1 set;10 reps      Manual Therapy   Manual Therapy  Taping;Soft tissue mobilization    Manual therapy comments  MTPR along the vastus lateralis x 3    Soft tissue mobilization  IASTM along VL    McConnell  Lateral > med             PT Education - 02/14/19 1538    Education Details  updated HEP, muscle  anatomy of knee, and SIJ with effects of hamstrings.    Person(s) Educated  Patient    Methods  Explanation;Verbal cues;Handout    Comprehension  Verbalized understanding;Verbal cues required       PT Short Term Goals - 01/30/19 1417      PT SHORT TERM GOAL #1   Title  pt to be I with inital HEP    Time  4    Period  Weeks    Status  New    Target Date  02/27/19      PT SHORT TERM GOAL #2   Title  pt to be able to utilize heel strike/ toe off gait pattern to promote gait efficency with LRAD with </= 4/10 pain    Time  4    Period  Weeks    Status  New    Target Date  02/27/19      PT SHORT TERM GOAL #3   Title  increase R knee flexion to >/= 105 degrees to promote funtional knee ROM    Time  4    Period  Weeks    Status  New    Target Date  02/27/19        PT Long Term Goals - 01/30/19 1419      PT LONG TERM GOAL #1   Title  increase R knee ROM to >/= 2 - 120 with </= 2/10 pain for functoinal ROM required for functional and efficent gait    Time  8    Period  Weeks    Status  New    Target Date  03/27/19      PT LONG TERM GOAL #2   Title  pt to be  able to sit/ stand and walk >/= 60 min and navigate up/dwon >/= 18 steps with LRAD for functional mobility and endurance for work related activities    Time  8    Period  Weeks    Status  New    Target Date  03/27/19      PT LONG TERM GOAL #3   Title  increase R knee strength to >/= 4+/5 to promote stability with walking/ standing    Time  8    Period  Weeks    Status  New    Target Date  03/27/19      PT LONG TERM GOAL #4   Title  increase FOTO score to </= 40% limited to demo improvement in function    Time  8    Period  Weeks    Status  New    Target Date  03/27/19      PT LONG TERM GOAL #5   Title  pt to be I with all HEP given as of last visit to maintain current level of function    Time  8    Period  Weeks    Status  New    Target Date  03/27/19            Plan - 02/14/19 1546    Clinical Impression Statement  pt reports increased  Rknee pain and R low back pain. worked on STW along vastus lateralis and VMO activation. trialed McConnel taping which he noted significant relief of knee pain. worked on hasmtring stretching and hip flexor activati nto address potential SIJ involvement. end of session he noted decrased pain inthe knee/ back.    PT Treatment/Interventions  ADLs/Self Care Home Management;Cryotherapy;Electrical Stimulation;Iontophoresis '4mg'$ /ml Dexamethasone;Moist Heat;Ultrasound;Therapeutic activities;Therapeutic exercise;Balance  training;Neuromuscular re-education;Manual techniques;Passive range of motion;Dry needling;Taping;Vasopneumatic Device    PT Next Visit Plan  review/ update HEP, how was taping, potential SIJ involvement on R,  knee stretching and ROM, patellar mobility, CKC strengthening/ progression, vaso for swelling    PT Home Exercise Plan  standing lateral weight shifting, forward weight shifting, sidelying hip abduction, quad set with ball squeeze, heel slide with strap, hamstring stretch, hip flexor MET, quad set with ball squeeze.    Consulted  and Agree with Plan of Care  Patient       Patient will benefit from skilled therapeutic intervention in order to improve the following deficits and impairments:  Improper body mechanics, Postural dysfunction, Pain, Decreased balance, Decreased activity tolerance, Abnormal gait, Decreased strength, Increased edema, Increased fascial restricitons, Decreased endurance, Decreased range of motion, Obesity  Visit Diagnosis: Chronic pain of right knee  Muscle weakness (generalized)  Other abnormalities of gait and mobility     Problem List There are no active problems to display for this patient.  Starr [place] PT, DPT, LAT, ATC  02/14/19  4:35 PM      [facility] [address] [place], [place], [address] Phone: [phone]   Fax:  [phone]  Name: Gregory Blanchard. MRN: [id number] Date of Birth: [birth year]

## 2019-02-18 ENCOUNTER — Ambulatory Visit: Payer: Self-pay | Admitting: Physical Therapy

## 2019-02-19 ENCOUNTER — Ambulatory Visit: Payer: Self-pay | Admitting: Physical Therapy

## 2019-02-19 ENCOUNTER — Other Ambulatory Visit: Payer: Self-pay

## 2019-02-19 ENCOUNTER — Encounter: Payer: Self-pay | Admitting: Physical Therapy

## 2019-02-19 DIAGNOSIS — M6281 Muscle weakness (generalized): Secondary | ICD-10-CM

## 2019-02-19 DIAGNOSIS — R2689 Other abnormalities of gait and mobility: Secondary | ICD-10-CM

## 2019-02-19 DIAGNOSIS — G8929 Other chronic pain: Secondary | ICD-10-CM

## 2019-02-19 NOTE — Therapy (Signed)
[place], [place], [address] Phone: [phone]   Fax:  [phone]  Physical Therapy Treatment  Patient Details  Name: Gregory Blanchard. MRN: [id number] Date of Birth: July 21, 1960 Referring Provider (PT):  Ainsley Spinner, [place]   Encounter Date: 02/19/2019  PT End of Session - 02/19/19 1115    Visit Number  4    Number of Visits  17    Date for PT Re-Evaluation  03/27/19    Authorization Type  Self Pay    PT Start Time  1102    PT Stop Time  1140    PT Time Calculation (min)  38 min       Past Medical History:  Diagnosis Date  . Family history of adverse reaction to anesthesia    mother- difficulty waking   . GERD (gastroesophageal reflux disease)   . History of kidney stones   . Hypertension   . PONV (postoperative nausea and vomiting)   . Seizures ([place])     Past Surgical History:  Procedure Laterality Date  . APPENDECTOMY    . CHOLECYSTECTOMY    . EYE SURGERY    . GASTRIC BYPASS  2006  . KNEE ARTHROSCOPY WITH MEDIAL MENISECTOMY Right 01/07/2019   Procedure: KNEE ARTHROSCOPY WITH MEDIAL MENISECTOMY;  Surgeon: Altamese Henry, MD;  Location: [facility];  Service: Orthopedics;  Laterality: Right;  . KNEE SURGERY Right   . LITHOTRIPSY    . ROTATOR CUFF REPAIR Left   . TONSILLECTOMY      There were no vitals filed for this visit.  Subjective Assessment - 02/19/19 1105    Subjective  My back and my knee are hurting. I am having a rough day. It was better after last session however now it is worse today.    Currently in Pain?  Yes    Pain Score  6     Pain Location  Knee    Pain Orientation  Right    Pain Descriptors / Indicators  Aching                       [facility] Adult PT Treatment/Exercise - 02/19/19 0001      Knee/Hip Exercises: Stretches   Active Hamstring Stretch  3 reps;30 seconds;Right    Active Hamstring Stretch Limitations  increased LBP     Other Knee/Hip  Stretches  Hip ER/IR via LTR, increased pain right low back       Knee/Hip Exercises: Aerobic   Nustep  L5 x 28mn UE/LE      Knee/Hip Exercises: Supine   Quad Sets  15 reps    Straight Leg Raises  --    Straight Leg Raises Limitations  2 reps, increased LBP, needed to sit up      Manual Therapy   McConnell  Lateral > med               PT Short Term Goals - 01/30/19 1417      PT SHORT TERM GOAL #1   Title  pt to be I with inital HEP    Time  4    Period  Weeks    Status  New    Target Date  02/27/19      PT SHORT TERM GOAL #2   Title  pt to be able to utilize heel strike/ toe off gait pattern to promote gait efficency with LRAD with </= 4/10 pain    Time  4    Period  Weeks    Status  New    Target Date  02/27/19      PT SHORT TERM GOAL #3   Title  increase R knee flexion to >/= 105 degrees to promote funtional knee ROM    Time  4    Period  Weeks    Status  New    Target Date  02/27/19        PT Long Term Goals - 01/30/19 1419      PT LONG TERM GOAL #1   Title  increase R knee ROM to >/= 2 - 120 with </= 2/10 pain for functoinal ROM required for functional and efficent gait    Time  8    Period  Weeks    Status  New    Target Date  03/27/19      PT LONG TERM GOAL #2   Title  pt to be able to sit/ stand and walk >/= 60 min and navigate up/dwon >/= 18 steps with LRAD for functional mobility and endurance for work related activities    Time  8    Period  Weeks    Status  New    Target Date  03/27/19      PT LONG TERM GOAL #3   Title  increase R knee strength to >/= 4+/5 to promote stability with walking/ standing    Time  8    Period  Weeks    Status  New    Target Date  03/27/19      PT LONG TERM GOAL #4   Title  increase FOTO score to </= 40% limited to demo improvement in function    Time  8    Period  Weeks    Status  New    Target Date  03/27/19      PT LONG TERM GOAL #5   Title  pt to be I with all HEP given as of last visit to  maintain current level of function    Time  8    Period  Weeks    Status  New    Target Date  03/27/19            Plan - 02/19/19 1107    Clinical Impression Statement  Pt saw MD yesterday who recommended he continue PT and he also received a knee injection. He reports improvement in knee pain however no change in low back pain. He did like the mcconnel tape which was reapplied. Sent electronic message to MD and PA-C to request referral to add LBP to current PT POC.    PT Next Visit Plan  review/ update HEP, how was taping, potential SIJ involvement on R,  knee stretching and ROM, patellar mobility, CKC strengthening/ progression, vaso for swelling    PT Home Exercise Plan  standing lateral weight shifting, forward weight shifting, sidelying hip abduction, quad set with ball squeeze, heel slide with strap, hamstring stretch, hip flexor MET, quad set with ball squeeze.       Patient will benefit from skilled therapeutic intervention in order to improve the following deficits and impairments:  Improper body mechanics, Postural dysfunction, Pain, Decreased balance, Decreased activity tolerance, Abnormal gait, Decreased strength, Increased edema, Increased fascial restricitons, Decreased endurance, Decreased range of motion, Obesity  Visit Diagnosis: Chronic pain of right knee  Muscle weakness (generalized)  Other abnormalities of gait and mobility     Problem List There are no  active problems to display for this patient.   Dorene Ar, [place] 02/19/2019, 12:30 PM  [facility], [place], [address] Phone: [phone]   Fax:  [phone]  Name: Gregory Blanchard. MRN: [id number] Date of Birth: 11/28/1960

## 2019-02-20 ENCOUNTER — Encounter: Payer: Self-pay | Admitting: Physical Therapy

## 2019-02-20 ENCOUNTER — Ambulatory Visit: Payer: Self-pay | Admitting: Physical Therapy

## 2019-02-20 DIAGNOSIS — M62830 Muscle spasm of back: Secondary | ICD-10-CM

## 2019-02-20 DIAGNOSIS — G8929 Other chronic pain: Secondary | ICD-10-CM

## 2019-02-20 DIAGNOSIS — R2689 Other abnormalities of gait and mobility: Secondary | ICD-10-CM

## 2019-02-20 DIAGNOSIS — M6281 Muscle weakness (generalized): Secondary | ICD-10-CM

## 2019-02-20 NOTE — Therapy (Signed)
[place], [place], [address] Phone: [phone]   Fax:  [phone]  Physical Therapy Treatment / Re-evaluation  Patient Details  Name: Gregory Blanchard. MRN: [id number] Date of Birth: 05-24-1960 Referring Provider (PT):  Ainsley Spinner, [place]   Encounter Date: 02/20/2019  PT End of Session - 02/20/19 1230    Visit Number  5    Number of Visits  17    Date for PT Re-Evaluation  03/27/19    Authorization Type  Self Pay    PT Start Time  0930    PT Stop Time  1015    PT Time Calculation (min)  45 min    Activity Tolerance  Patient tolerated treatment well    Behavior During Therapy  [facility] for tasks assessed/performed       Past Medical History:  Diagnosis Date  . Family history of adverse reaction to anesthesia    mother- difficulty waking   . GERD (gastroesophageal reflux disease)   . History of kidney stones   . Hypertension   . PONV (postoperative nausea and vomiting)   . Seizures ([place])     Past Surgical History:  Procedure Laterality Date  . APPENDECTOMY    . CHOLECYSTECTOMY    . EYE SURGERY    . GASTRIC BYPASS  2006  . KNEE ARTHROSCOPY WITH MEDIAL MENISECTOMY Right 01/07/2019   Procedure: KNEE ARTHROSCOPY WITH MEDIAL MENISECTOMY;  Surgeon: Altamese [place], MD;  Location: [facility];  Service: Orthopedics;  Laterality: Right;  . KNEE SURGERY Right   . LITHOTRIPSY    . ROTATOR CUFF REPAIR Left   . TONSILLECTOMY      There were no vitals filed for this visit.  Subjective Assessment - 02/20/19 0937    Subjective  pt reports back pain that has been going on for years and reports increased exacerbation since the R knee surgery, pain stays mostly in the back and he denies any red flags. I got a injection inthe knee on Monday and it is doing better today.    Patient Stated Goals  to decrease pain, increase strength before having to get the knee replaced, get back to golfing activities.    Currently in Pain?  Yes    Pain Score  3     Pain Orientation  Right    Pain Descriptors / Indicators  Aching;Sore    Pain Type  Surgical pain    Aggravating Factors   standing/ walking for long periods of time, bending, sitting    Pain Relieving Factors  rest, ice, med, shower    Multiple Pain Sites  Yes    Pain Score  7    Pain Location  Back    Pain Orientation  Right    Pain Descriptors / Indicators  Aching;Sore;Tightness;Sharp    Pain Type  Chronic pain    Pain Radiating Towards  occasional referral down the R upper thigh    Pain Onset  More than a month ago    Pain Frequency  Intermittent    Aggravating Factors   standing/ walking, sitting, bending forward any lifting    Pain Relieving Factors  stretching, changing position    Effect of Pain on Daily Activities  limited endurance         [facility] PT Assessment - 02/20/19 0001      Assessment   Medical Diagnosis  R knee Arthroscopic menisectomy, Low back pain    Referring Provider (PT)   Eddie Dibbles,  Lanny Hurst, PA-C    Onset Date/Surgical Date  01/07/19    Hand Dominance  Right    Next MD Visit  02/18/2019    Prior Therapy  yes      Precautions   Precautions  None      Restrictions   Weight Bearing Restrictions  No      Balance Screen   Has the patient fallen in the past 6 months  Yes      AROM   AROM Assessment Site  Lumbar    Right Knee Flexion  111    Lumbar Flexion  32    Lumbar Extension  18    Lumbar - Right Side Bend  20    Lumbar - Left Side Bend  18      Palpation   Palpation comment  TTP along the R SIJ and lumbar paraspinals      Special Tests    Special Tests  Sacrolliac Tests    Sacroiliac Tests   Gaenslen's Test   forward flexion testing (+) on the R      Pelvic Compression   Findings  Negative      Gaenslen's test   Findings  Positive    Side   Right                   [facility] Adult PT Treatment/Exercise - 02/20/19 0001      Self-Care   Self-Care  Other Self-Care Comments    Other  Self-Care Comments   benefits of SIJ belt to promtoe stability and where he can purchase one      Knee/Hip Exercises: Stretches   Active Hamstring Stretch  3 reps;30 seconds   PNF contract/relax     Knee/Hip Exercises: Aerobic   Nustep  L5 x 63mn UE/LE      Manual Therapy   Manual Therapy  Muscle Energy Technique;Joint mobilization    Manual therapy comments  MTPR along the hamsring x 2, R lumbar paraspinals x 3    Joint Mobilization  grade V LAD on the LLE,  R innominate anterior mobs grade III in L sidellying (cavitation noted during mobs with relief of pain)    Muscle Energy Technique  MET on R hip flexor resistance               PT Short Term Goals - 02/20/19 1236      PT SHORT TERM GOAL #1   Title  pt to be I with inital HEP    Period  Weeks    Status  Achieved      PT SHORT TERM GOAL #2   Title  pt to be able to utilize heel strike/ toe off gait pattern to promote gait efficency with LRAD with </= 4/10 pain    Period  Weeks    Status  On-going      PT SHORT TERM GOAL #3   Title  increase R knee flexion to >/= 105 degrees to promote funtional knee ROM    Period  Weeks    Status  Achieved        PT Long Term Goals - 02/20/19 1236      PT LONG TERM GOAL #1   Title  increase R knee ROM to >/= 2 - 120 with </= 2/10 pain for functoinal ROM required for functional and efficent gait    Time  8    Period  Weeks    Status  On-going  PT LONG TERM GOAL #2   Title  pt to be able to sit/ stand and walk >/= 60 min and navigate up/dwon >/= 18 steps with LRAD for functional mobility and endurance for work related activities    Period  Weeks    Status  On-going      PT LONG TERM GOAL #3   Title  increase R knee strength to >/= 4+/5 to promote stability with walking/ standing    Period  Weeks    Status  On-going      PT LONG TERM GOAL #4   Title  increase FOTO score to </= 40% limited to demo improvement in function    Period  Weeks    Status  On-going       PT LONG TERM GOAL #5   Title  pt to be I with all HEP given as of last visit to maintain current level of function    Period  Weeks    Status  On-going      Additional Long Term Goals   Additional Long Term Goals  Yes      PT LONG TERM GOAL #6   Title  pt to report decreased muscle tension to promtoe trunk ROM and reduce pain to </= 2/10 pain for QOL    Time  6    Period  Weeks    Status  New    Target Date  03/27/19      PT LONG TERM GOAL #7   Title  improve trunk flexion to >/= 60 degreees and exension/ bil sidebending by >/= 10 degrees to promote functional trunk ROM for ADLs    Time  6    Period  Weeks    Status  New    Target Date  03/27/19            Plan - 02/20/19 1232    Clinical Impression Statement  Verbal confirmation from provider to add R LBP to plan of care and plans to send referral.  He demonstrates limited trunk mobility in all planes with pain noted at the R SIJ. Cluster special testing suggest high likelihood of involvement. He responded well to hasmtring stretching and MET and STW to relieve tension inthe low back and hamstring. He would benefit from physical therapy to decrease low back pain, reduce spasm, improve over function by addressing the defictis listed.    Personal Factors and Comorbidities  Comorbidity 2    Examination-Activity Limitations  Stairs;Stand;Locomotion Level    PT Treatment/Interventions  ADLs/Self Care Home Management;Cryotherapy;Electrical Stimulation;Iontophoresis 45m/ml Dexamethasone;Moist Heat;Ultrasound;Therapeutic activities;Therapeutic exercise;Balance training;Neuromuscular re-education;Manual techniques;Passive range of motion;Dry needling;Taping;Vasopneumatic Device    PT Next Visit Plan  review/ update HEP, how was taping, potential SIJ involvement on R,  knee stretching and ROM, patellar mobility, CKC strengthening/ progression, vaso for swelling    PT Home Exercise Plan  standing lateral weight shifting, forward weight  shifting, sidelying hip abduction, quad set with ball squeeze, heel slide with strap, hamstring stretch, hip flexor MET, quad set with ball squeeze.    Consulted and Agree with Plan of Care  Patient       Patient will benefit from skilled therapeutic intervention in order to improve the following deficits and impairments:  Improper body mechanics, Postural dysfunction, Pain, Decreased balance, Decreased activity tolerance, Abnormal gait, Decreased strength, Increased edema, Increased fascial restricitons, Decreased endurance, Decreased range of motion, Obesity  Visit Diagnosis: Chronic pain of right knee  Muscle weakness (generalized)  Other abnormalities of  gait and mobility  Chronic right-sided low back pain, unspecified whether sciatica present  Muscle spasm of back     Problem List There are no active problems to display for this patient.    Starr Lake PT, DPT, LAT, ATC  02/20/19  12:39 PM     [facility] [address] [place], [place], [address] Phone: [phone]   Fax:  [phone]  Name: Gregory Blanchard. MRN: [id number] Date of Birth: 10/21/1960

## 2019-02-25 ENCOUNTER — Ambulatory Visit: Payer: Self-pay | Admitting: Physical Therapy

## 2019-02-25 ENCOUNTER — Other Ambulatory Visit: Payer: Self-pay

## 2019-02-25 DIAGNOSIS — Z20822 Contact with and (suspected) exposure to covid-19: Secondary | ICD-10-CM

## 2019-02-26 LAB — NOVEL CORONAVIRUS, NAA: SARS-CoV-2, NAA: NOT DETECTED

## 2019-02-27 ENCOUNTER — Ambulatory Visit: Payer: Self-pay | Admitting: Physical Therapy

## 2019-03-05 ENCOUNTER — Ambulatory Visit: Payer: Self-pay | Attending: Orthopedic Surgery | Admitting: Physical Therapy

## 2019-03-05 ENCOUNTER — Encounter: Payer: Self-pay | Admitting: Physical Therapy

## 2019-03-05 ENCOUNTER — Other Ambulatory Visit: Payer: Self-pay

## 2019-03-05 DIAGNOSIS — R2689 Other abnormalities of gait and mobility: Secondary | ICD-10-CM | POA: Insufficient documentation

## 2019-03-05 DIAGNOSIS — M25561 Pain in right knee: Secondary | ICD-10-CM | POA: Insufficient documentation

## 2019-03-05 DIAGNOSIS — M545 Low back pain: Secondary | ICD-10-CM | POA: Insufficient documentation

## 2019-03-05 DIAGNOSIS — M62830 Muscle spasm of back: Secondary | ICD-10-CM | POA: Insufficient documentation

## 2019-03-05 DIAGNOSIS — G8929 Other chronic pain: Secondary | ICD-10-CM | POA: Insufficient documentation

## 2019-03-05 DIAGNOSIS — M6281 Muscle weakness (generalized): Secondary | ICD-10-CM | POA: Insufficient documentation

## 2019-03-05 NOTE — Therapy (Signed)
[place], [place], [address] Phone: [phone]   Fax:  [phone]  Physical Therapy Treatment  Patient Details  Name: Gregory Blanchard. MRN: [id number] Date of Birth: May 30, 1960 Referring Provider (PT):  Ainsley Spinner, [place]   Encounter Date: 03/05/2019  PT End of Session - 03/05/19 0936    Visit Number  6    Number of Visits  17    Date for PT Re-Evaluation  03/27/19    PT Start Time  0933    PT Stop Time  1021    PT Time Calculation (min)  48 min       Past Medical History:  Diagnosis Date  . Family history of adverse reaction to anesthesia    mother- difficulty waking   . GERD (gastroesophageal reflux disease)   . History of kidney stones   . Hypertension   . PONV (postoperative nausea and vomiting)   . Seizures ([place])     Past Surgical History:  Procedure Laterality Date  . APPENDECTOMY    . CHOLECYSTECTOMY    . EYE SURGERY    . GASTRIC BYPASS  2006  . KNEE ARTHROSCOPY WITH MEDIAL MENISECTOMY Right 01/07/2019   Procedure: KNEE ARTHROSCOPY WITH MEDIAL MENISECTOMY;  Surgeon: Altamese [place], MD;  Location: [facility];  Service: Orthopedics;  Laterality: Right;  . KNEE SURGERY Right   . LITHOTRIPSY    . ROTATOR CUFF REPAIR Left   . TONSILLECTOMY      There were no vitals filed for this visit.  Subjective Assessment - 03/05/19 0935    Subjective  Pt arrives without AD and reports less back and knee pain.    Currently in Pain?  Yes    Pain Score  2     Pain Location  Knee    Pain Orientation  Right    Pain Descriptors / Indicators  Aching;Sore    Pain Type  Surgical pain    Aggravating Factors   standing, walking for long periods of time, bending, sitting    Pain Score  3    Pain Location  Back    Pain Orientation  Right    Pain Descriptors / Indicators  Aching;Sore    Pain Type  Chronic pain         [facility] PT Assessment - 03/05/19 0001      AROM   Right Knee Flexion  118                    [facility] Adult PT Treatment/Exercise - 03/05/19 0001      Knee/Hip Exercises: Stretches   Active Hamstring Stretch  3 reps;30 seconds   PNF contract/relax   Hip Flexor Stretch  3 reps    Other Knee/Hip Stretches  Lower trunk rotations , piriformis stretch     Other Knee/Hip Stretches  single knee to chest       Knee/Hip Exercises: Aerobic   Nustep  L5 x 78mn LE only       Knee/Hip Exercises: Standing   Lateral Step Up  10 reps    Forward Step Up  20 reps    Forward Step Up Limitations  Opp knee drive     Functional Squat Limitations  sing squat x 10     Stairs  up and down reciprocal difficult descending       Knee/Hip Exercises: Seated   Sit to Sand  10 reps      Knee/Hip Exercises: Supine  Bridges  20 reps    Straight Leg Raises  1 set;10 reps      Vasopneumatic   Number Minutes Vasopneumatic   10 minutes    Vasopnuematic Location   Knee    Vasopneumatic Pressure  Low    Vasopneumatic Temperature   34               PT Short Term Goals - 02/20/19 1236      PT SHORT TERM GOAL #1   Title  pt to be I with inital HEP    Period  Weeks    Status  Achieved      PT SHORT TERM GOAL #2   Title  pt to be able to utilize heel strike/ toe off gait pattern to promote gait efficency with LRAD with </= 4/10 pain    Period  Weeks    Status  On-going      PT SHORT TERM GOAL #3   Title  increase R knee flexion to >/= 105 degrees to promote funtional knee ROM    Period  Weeks    Status  Achieved        PT Long Term Goals - 02/20/19 1236      PT LONG TERM GOAL #1   Title  increase R knee ROM to >/= 2 - 120 with </= 2/10 pain for functoinal ROM required for functional and efficent gait    Time  8    Period  Weeks    Status  On-going      PT LONG TERM GOAL #2   Title  pt to be able to sit/ stand and walk >/= 60 min and navigate up/dwon >/= 18 steps with LRAD for functional mobility and endurance for work related activities    Period   Weeks    Status  On-going      PT LONG TERM GOAL #3   Title  increase R knee strength to >/= 4+/5 to promote stability with walking/ standing    Period  Weeks    Status  On-going      PT LONG TERM GOAL #4   Title  increase FOTO score to </= 40% limited to demo improvement in function    Period  Weeks    Status  On-going      PT LONG TERM GOAL #5   Title  pt to be I with all HEP given as of last visit to maintain current level of function    Period  Weeks    Status  On-going      Additional Long Term Goals   Additional Long Term Goals  Yes      PT LONG TERM GOAL #6   Title  pt to report decreased muscle tension to promtoe trunk ROM and reduce pain to </= 2/10 pain for QOL    Time  6    Period  Weeks    Status  New    Target Date  03/27/19      PT LONG TERM GOAL #7   Title  improve trunk flexion to >/= 60 degreees and exension/ bil sidebending by >/= 10 degrees to promote functional trunk ROM for ADLs    Time  6    Period  Weeks    Status  New    Target Date  03/27/19            Plan - 03/05/19 1021    Clinical Impression Statement  Pt arrives without AD  and reports decreased lumbar and knee pain. He is able to tolerate closed chain LE strengthening today. Knee flexion AROM improved to 118. Diffiiculty with eccentric control descending stairs. Vaso at end of session to decreased knn pain and soreness.    PT Next Visit Plan  review/ update HEP, how was taping, potential SIJ involvement on R,  knee stretching and ROM, patellar mobility, CKC strengthening/ progression, vaso for swelling    PT Home Exercise Plan  standing lateral weight shifting, forward weight shifting, sidelying hip abduction, quad set with ball squeeze, heel slide with strap, hamstring stretch, hip flexor MET, quad set with ball squeeze.       Patient will benefit from skilled therapeutic intervention in order to improve the following deficits and impairments:  Improper body mechanics, Postural  dysfunction, Pain, Decreased balance, Decreased activity tolerance, Abnormal gait, Decreased strength, Increased edema, Increased fascial restricitons, Decreased endurance, Decreased range of motion, Obesity  Visit Diagnosis: Chronic pain of right knee  Muscle weakness (generalized)  Chronic right-sided low back pain, unspecified whether sciatica present  Muscle spasm of back  Other abnormalities of gait and mobility     Problem List There are no active problems to display for this patient.   Dorene Ar, [place] 03/05/2019, 10:22 AM  [facility] [address] [place], [place], [address] Phone: [phone]   Fax:  [phone]  Name: Cortlan Dolin. MRN: [id number] Date of Birth: [birth year]

## 2019-03-07 ENCOUNTER — Encounter: Payer: Self-pay | Admitting: Physical Therapy

## 2019-03-07 ENCOUNTER — Other Ambulatory Visit: Payer: Self-pay

## 2019-03-07 ENCOUNTER — Ambulatory Visit: Payer: Self-pay | Admitting: Physical Therapy

## 2019-03-07 DIAGNOSIS — M62830 Muscle spasm of back: Secondary | ICD-10-CM

## 2019-03-07 DIAGNOSIS — M545 Low back pain, unspecified: Secondary | ICD-10-CM

## 2019-03-07 DIAGNOSIS — M6281 Muscle weakness (generalized): Secondary | ICD-10-CM

## 2019-03-07 DIAGNOSIS — G8929 Other chronic pain: Secondary | ICD-10-CM

## 2019-03-07 DIAGNOSIS — R2689 Other abnormalities of gait and mobility: Secondary | ICD-10-CM

## 2019-03-07 NOTE — Therapy (Signed)
[place], [place], [address] Phone: [phone]   Fax:  [phone]  Physical Therapy Treatment  Patient Details  Name: Gregory Blanchard. MRN: [id number] Date of Birth: [birth year] Referring Provider (PT):  Ainsley Spinner, [place]   Encounter Date: 03/07/2019  PT End of Session - 03/07/19 0851    Visit Number  7    Number of Visits  17    Date for PT Re-Evaluation  03/27/19    PT Start Time  0851    PT Stop Time  0930    PT Time Calculation (min)  39 min    Activity Tolerance  Patient tolerated treatment well    Behavior During Therapy  [facility] for tasks assessed/performed       Past Medical History:  Diagnosis Date  . Family history of adverse reaction to anesthesia    mother- difficulty waking   . GERD (gastroesophageal reflux disease)   . History of kidney stones   . Hypertension   . PONV (postoperative nausea and vomiting)   . Seizures ([place])     Past Surgical History:  Procedure Laterality Date  . APPENDECTOMY    . CHOLECYSTECTOMY    . EYE SURGERY    . GASTRIC BYPASS  2006  . KNEE ARTHROSCOPY WITH MEDIAL MENISECTOMY Right 01/07/2019   Procedure: KNEE ARTHROSCOPY WITH MEDIAL MENISECTOMY;  Surgeon: Altamese Grover, MD;  Location: [place];  Service: Orthopedics;  Laterality: Right;  . KNEE SURGERY Right   . LITHOTRIPSY    . ROTATOR CUFF REPAIR Left   . TONSILLECTOMY      There were no vitals filed for this visit.  Subjective Assessment - 03/07/19 0852    Subjective  "I got a good workout the last session, I am little sore in the back"    Pain Score  3     Pain Location  Back    Pain Onset  More than a month ago    Pain Frequency  Intermittent    Pain Score  2    Pain Location  Knee    Pain Orientation  Right    Pain Descriptors / Indicators  Aching;Sore    Pain Type  Chronic pain    Pain Onset  More than a month ago    Pain Frequency  Intermittent    Aggravating Factors   standing/  walking    Pain Relieving Factors  stretch    Effect of Pain on Daily Activities  limited endurnace         [facility] PT Assessment - 03/07/19 0001      Assessment   Medical Diagnosis  R knee Arthroscopic menisectomy, Low back pain    Referring Provider (PT)   Ainsley Spinner, PA-C    Onset Date/Surgical Date  01/07/19      Observation/Other Assessments   Focus on Therapeutic Outcomes (FOTO)   51% limited      AROM   Right Knee Extension  0    Right Knee Flexion  122                   [facility] Adult PT Treatment/Exercise - 03/07/19 0001      Knee/Hip Exercises: Stretches   Active Hamstring Stretch  3 reps;30 seconds    ITB Stretch  2 reps;30 seconds;Right      Knee/Hip Exercises: Aerobic   Elliptical  L2 x 4 min elevation L0      Knee/Hip Exercises: Machines for  Strengthening   Cybex Knee Extension  2 x 10 20# 1 set bil, 1 set bil con/ RLE ecc    Cybex Leg Press  2 x 10 40# 1 set bil LE, 1 set RLE only   empahsis on eccentric control     Manual Therapy   Manual therapy comments  MTPR along the R glute med x 3    Joint Mobilization  grade IV RLE LAD          Balance Exercises - 03/07/19 0934      Balance Exercises: Standing   Standing Eyes Opened  Narrow base of support (BOS);1 rep;30 secs    Standing Eyes Closed  Narrow base of support (BOS);2 reps;30 secs    Partial Tandem Stance  Eyes closed;Eyes open;2 reps;30 secs          PT Short Term Goals - 02/20/19 1236      PT SHORT TERM GOAL #1   Title  pt to be I with inital HEP    Period  Weeks    Status  Achieved      PT SHORT TERM GOAL #2   Title  pt to be able to utilize heel strike/ toe off gait pattern to promote gait efficency with LRAD with </= 4/10 pain    Period  Weeks    Status  On-going      PT SHORT TERM GOAL #3   Title  increase R knee flexion to >/= 105 degrees to promote funtional knee ROM    Period  Weeks    Status  Achieved        PT Long Term Goals - 02/20/19 1236      PT  LONG TERM GOAL #1   Title  increase R knee ROM to >/= 2 - 120 with </= 2/10 pain for functoinal ROM required for functional and efficent gait    Time  8    Period  Weeks    Status  On-going      PT LONG TERM GOAL #2   Title  pt to be able to sit/ stand and walk >/= 60 min and navigate up/dwon >/= 18 steps with LRAD for functional mobility and endurance for work related activities    Period  Weeks    Status  On-going      PT LONG TERM GOAL #3   Title  increase R knee strength to >/= 4+/5 to promote stability with walking/ standing    Period  Weeks    Status  On-going      PT LONG TERM GOAL #4   Title  increase FOTO score to </= 40% limited to demo improvement in function    Period  Weeks    Status  On-going      PT LONG TERM GOAL #5   Title  pt to be I with all HEP given as of last visit to maintain current level of function    Period  Weeks    Status  On-going      Additional Long Term Goals   Additional Long Term Goals  Yes      PT LONG TERM GOAL #6   Title  pt to report decreased muscle tension to promtoe trunk ROM and reduce pain to </= 2/10 pain for QOL    Time  6    Period  Weeks    Status  New    Target Date  03/27/19      PT LONG TERM  GOAL #7   Title  improve trunk flexion to >/= 60 degreees and exension/ bil sidebending by >/= 10 degrees to promote functional trunk ROM for ADLs    Time  6    Period  Weeks    Status  New    Target Date  03/27/19            Plan - 03/07/19 0934    Clinical Impression Statement  Mr Dunivan continues to make progress with physical therapy noting reduce back and knee pain, and increased R knee flexion to 122 degrees today. cotinued functional strengthening of the hip/ quad with emphasis on controlled eccentic control to promote muscle control with descending stairs.    PT Next Visit Plan  update HEP, taping PRN,  knee stretching and ROM, patellar mobility, CKC strengthening/ progression, vaso for swelling PRN    PT Home Exercise  Plan  standing lateral weight shifting, forward weight shifting, sidelying hip abduction, quad set with ball squeeze, heel slide with strap, hamstring stretch, hip flexor MET, quad set with ball squeeze.       Patient will benefit from skilled therapeutic intervention in order to improve the following deficits and impairments:  Improper body mechanics, Postural dysfunction, Pain, Decreased balance, Decreased activity tolerance, Abnormal gait, Decreased strength, Increased edema, Increased fascial restricitons, Decreased endurance, Decreased range of motion, Obesity  Visit Diagnosis: Chronic pain of right knee  Muscle weakness (generalized)  Muscle spasm of back  Chronic right-sided low back pain, unspecified whether sciatica present  Other abnormalities of gait and mobility     Problem List There are no active problems to display for this patient.  Starr Lake PT, DPT, LAT, ATC  03/07/19  9:39 AM      [facility] [address] [place], [place], [address] Phone: [phone]   Fax:  [phone]  Name: Kentaro Alewine. MRN: [id number] Date of Birth: 29-Apr-1960

## 2019-03-12 ENCOUNTER — Ambulatory Visit: Payer: Self-pay | Admitting: Physical Therapy

## 2019-03-14 ENCOUNTER — Encounter: Payer: Self-pay | Admitting: Physical Therapy

## 2019-03-14 ENCOUNTER — Ambulatory Visit: Payer: Self-pay | Admitting: Physical Therapy

## 2019-03-14 ENCOUNTER — Other Ambulatory Visit: Payer: Self-pay

## 2019-03-14 DIAGNOSIS — G8929 Other chronic pain: Secondary | ICD-10-CM

## 2019-03-14 DIAGNOSIS — M62830 Muscle spasm of back: Secondary | ICD-10-CM

## 2019-03-14 DIAGNOSIS — R2689 Other abnormalities of gait and mobility: Secondary | ICD-10-CM

## 2019-03-14 DIAGNOSIS — M25561 Pain in right knee: Secondary | ICD-10-CM

## 2019-03-14 DIAGNOSIS — M6281 Muscle weakness (generalized): Secondary | ICD-10-CM
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
## 2019-03-19 ENCOUNTER — Ambulatory Visit: Payer: Self-pay | Admitting: Physical Therapy

## 2019-03-20 ENCOUNTER — Telehealth: Payer: Self-pay | Admitting: Physical Therapy

## 2019-03-20 ENCOUNTER — Ambulatory Visit: Payer: Self-pay | Admitting: Physical Therapy

## 2019-03-20 NOTE — Telephone Encounter (Signed)
Left message regarding no show to last two  Appointments. Asked that he call to schedule a future appointment if he feels it is necessary.

## 2019-09-11 IMAGING — DX RIGHT KNEE - COMPLETE 4+ VIEW
5 series · 5 of 5 positions shown · non-contrast
Comparison: None.

CLINICAL DATA: Pain after fall.

EXAM:
RIGHT KNEE - COMPLETE 4+ VIEW

[knee ap]
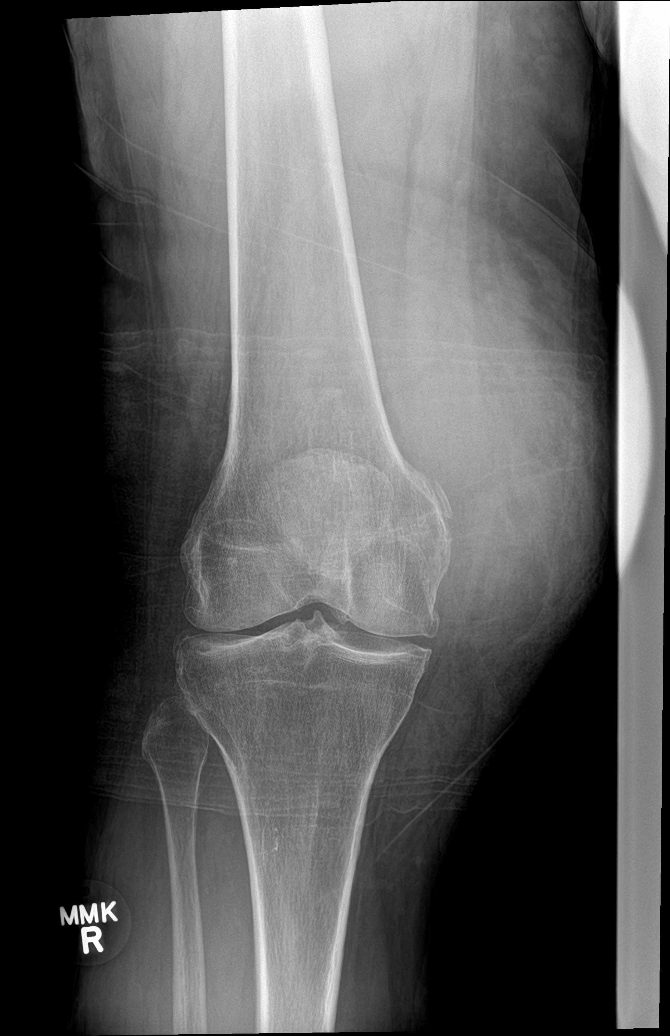

[knee lat]
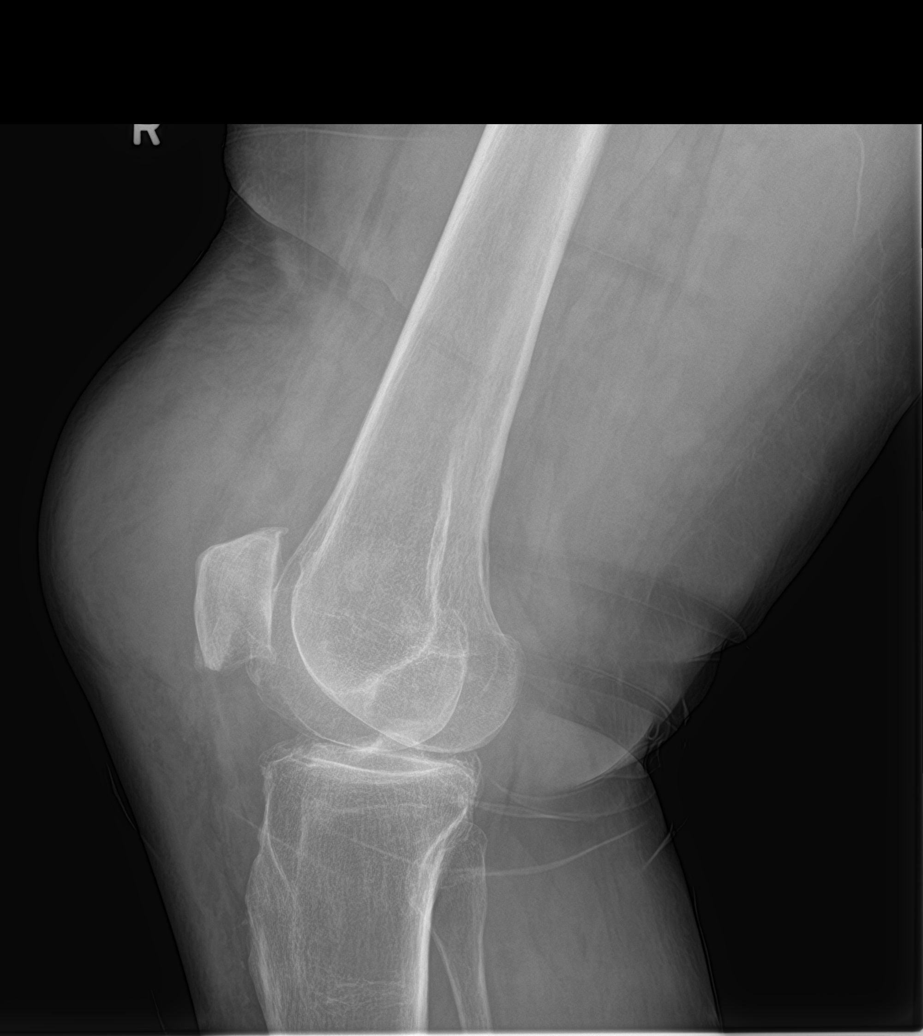

[knee obl (1 of 3)]
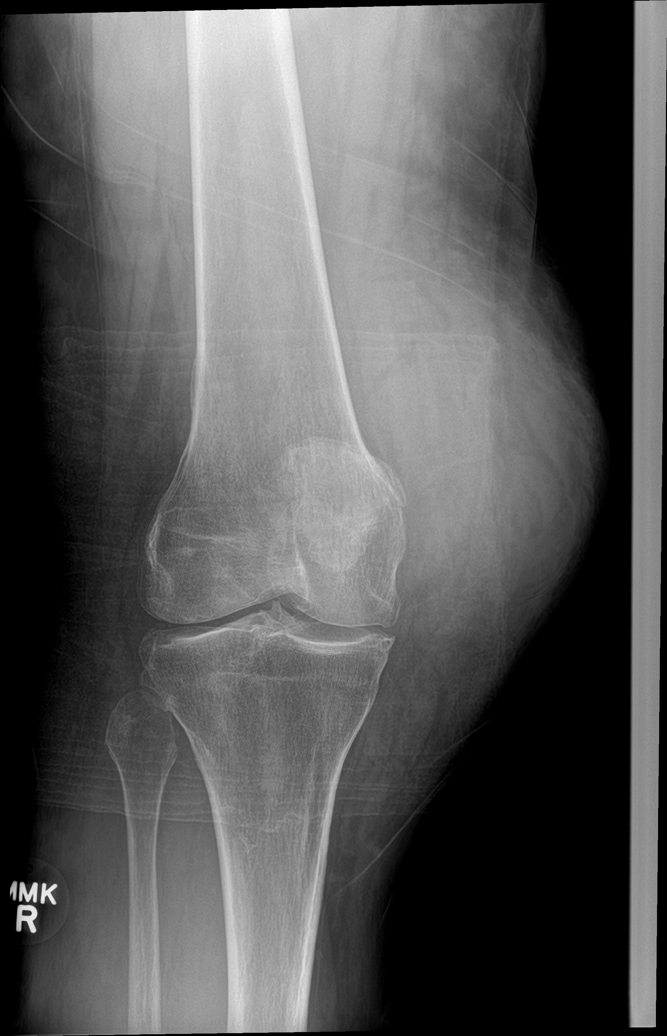

[knee obl (2 of 3)]
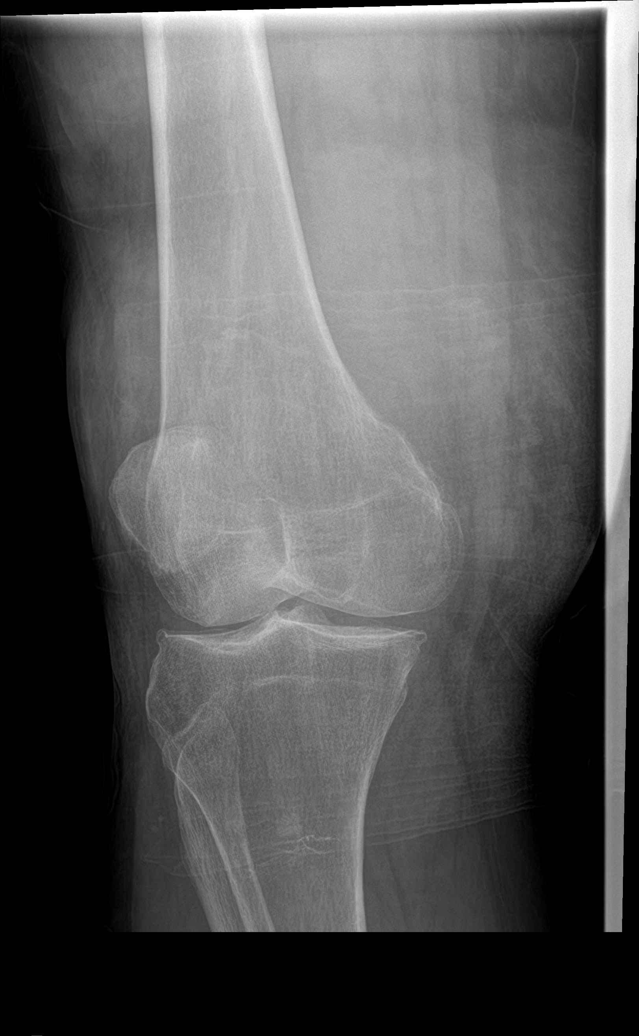

[knee obl (3 of 3)]
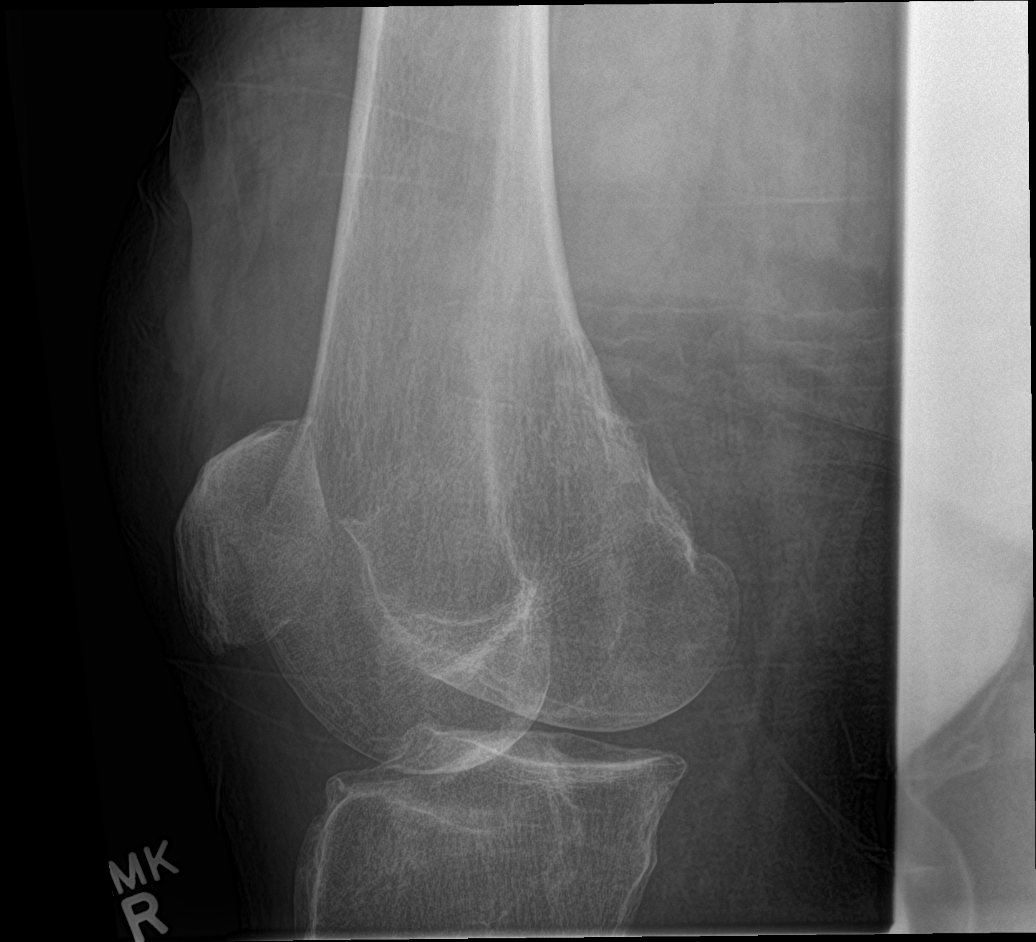

[5 of 5 positions shown; findings below may reference images not displayed]

FINDINGS: There is significant anterior soft tissue swelling, likely a
hematoma. There is a soft tissue calcification anterior to the
patella on the lateral view. The patella is otherwise normal in
appearance. No other fractures. No other abnormalities.
IMPRESSION: There is a soft tissue calcification anterior to the inferior aspect
of the patella on the lateral view with significant overlying soft
tissue swelling. The soft tissue calcification could represent a
small fracture fragment. No fracture lines are seen extending
through body of the patella.
